# Patient Record
Sex: Male | Born: 1960
Health system: Southern US, Community
[De-identification: ages and names within clinical notes are randomized; demographics above are authoritative.]

## PROBLEM LIST (undated history)

## (undated) DIAGNOSIS — K5792 Diverticulitis of intestine, part unspecified, without perforation or abscess without bleeding: Secondary | ICD-10-CM

## (undated) DIAGNOSIS — J45909 Unspecified asthma, uncomplicated: Secondary | ICD-10-CM

## (undated) DIAGNOSIS — E7800 Pure hypercholesterolemia, unspecified: Secondary | ICD-10-CM

## (undated) DIAGNOSIS — C61 Malignant neoplasm of prostate: Secondary | ICD-10-CM

## (undated) DIAGNOSIS — M353 Polymyalgia rheumatica: Secondary | ICD-10-CM

## (undated) DIAGNOSIS — Z87442 Personal history of urinary calculi: Secondary | ICD-10-CM

## (undated) DIAGNOSIS — I10 Essential (primary) hypertension: Secondary | ICD-10-CM

## (undated) HISTORY — DX: Unspecified asthma, uncomplicated: J45.909

## (undated) HISTORY — DX: Essential (primary) hypertension: I10

## (undated) HISTORY — DX: Malignant neoplasm of prostate: C61

## (undated) HISTORY — DX: Pure hypercholesterolemia, unspecified: E78.00

## (undated) HISTORY — PX: SPINE SURGERY: SHX786

## (undated) HISTORY — PX: NECK SURGERY: SHX720

---

## 2001-12-01 ENCOUNTER — Encounter: Payer: Self-pay | Admitting: Neurosurgery

## 2001-12-03 ENCOUNTER — Encounter: Payer: Self-pay | Admitting: Neurosurgery

## 2001-12-03 ENCOUNTER — Ambulatory Visit (HOSPITAL_COMMUNITY): Admission: RE | Admit: 2001-12-03 | Discharge: 2001-12-04 | Payer: Self-pay | Admitting: Neurosurgery

## 2002-01-02 ENCOUNTER — Ambulatory Visit (HOSPITAL_COMMUNITY): Admission: RE | Admit: 2002-01-02 | Discharge: 2002-01-02 | Payer: Self-pay | Admitting: Neurosurgery

## 2002-01-02 ENCOUNTER — Encounter: Payer: Self-pay | Admitting: Neurosurgery

## 2002-04-02 ENCOUNTER — Encounter: Payer: Self-pay | Admitting: Neurosurgery

## 2002-04-02 ENCOUNTER — Ambulatory Visit (HOSPITAL_COMMUNITY): Admission: RE | Admit: 2002-04-02 | Discharge: 2002-04-02 | Payer: Self-pay | Admitting: Neurosurgery

## 2003-01-21 ENCOUNTER — Ambulatory Visit (HOSPITAL_COMMUNITY): Admission: RE | Admit: 2003-01-21 | Discharge: 2003-01-21 | Payer: Self-pay | Admitting: Internal Medicine

## 2003-01-21 ENCOUNTER — Encounter: Payer: Self-pay | Admitting: Internal Medicine

## 2003-02-12 ENCOUNTER — Encounter: Admission: RE | Admit: 2003-02-12 | Discharge: 2003-02-12 | Payer: Self-pay | Admitting: Otolaryngology

## 2003-02-12 ENCOUNTER — Encounter: Payer: Self-pay | Admitting: Otolaryngology

## 2004-01-11 ENCOUNTER — Emergency Department (HOSPITAL_COMMUNITY): Admission: EM | Admit: 2004-01-11 | Discharge: 2004-01-11 | Payer: Self-pay | Admitting: *Deleted

## 2004-04-04 ENCOUNTER — Ambulatory Visit (HOSPITAL_COMMUNITY): Admission: RE | Admit: 2004-04-04 | Discharge: 2004-04-04 | Payer: Self-pay | Admitting: Pulmonary Disease

## 2005-08-10 ENCOUNTER — Ambulatory Visit (HOSPITAL_COMMUNITY): Admission: RE | Admit: 2005-08-10 | Discharge: 2005-08-10 | Payer: Self-pay | Admitting: Internal Medicine

## 2005-09-25 ENCOUNTER — Ambulatory Visit: Payer: Self-pay | Admitting: Internal Medicine

## 2005-10-12 ENCOUNTER — Encounter: Payer: Self-pay | Admitting: Internal Medicine

## 2005-10-12 ENCOUNTER — Ambulatory Visit: Payer: Self-pay | Admitting: Internal Medicine

## 2005-10-12 ENCOUNTER — Ambulatory Visit (HOSPITAL_COMMUNITY): Admission: RE | Admit: 2005-10-12 | Discharge: 2005-10-12 | Payer: Self-pay | Admitting: Internal Medicine

## 2007-11-03 ENCOUNTER — Ambulatory Visit: Payer: Self-pay | Admitting: Pulmonary Disease

## 2007-12-18 HISTORY — PX: ROBOT ASSISTED LAPAROSCOPIC RADICAL PROSTATECTOMY: SHX5141

## 2007-12-25 DIAGNOSIS — J45909 Unspecified asthma, uncomplicated: Secondary | ICD-10-CM | POA: Insufficient documentation

## 2007-12-25 DIAGNOSIS — E785 Hyperlipidemia, unspecified: Secondary | ICD-10-CM | POA: Insufficient documentation

## 2007-12-25 DIAGNOSIS — C61 Malignant neoplasm of prostate: Secondary | ICD-10-CM | POA: Insufficient documentation

## 2007-12-29 ENCOUNTER — Encounter: Payer: Self-pay | Admitting: Internal Medicine

## 2008-01-14 ENCOUNTER — Inpatient Hospital Stay (HOSPITAL_COMMUNITY): Admission: RE | Admit: 2008-01-14 | Discharge: 2008-01-16 | Payer: Self-pay | Admitting: Urology

## 2008-01-14 ENCOUNTER — Encounter (INDEPENDENT_AMBULATORY_CARE_PROVIDER_SITE_OTHER): Payer: Self-pay | Admitting: Urology

## 2009-04-11 DIAGNOSIS — T7840XA Allergy, unspecified, initial encounter: Secondary | ICD-10-CM | POA: Insufficient documentation

## 2009-04-11 DIAGNOSIS — K921 Melena: Secondary | ICD-10-CM | POA: Insufficient documentation

## 2009-04-12 ENCOUNTER — Telehealth (INDEPENDENT_AMBULATORY_CARE_PROVIDER_SITE_OTHER): Payer: Self-pay

## 2009-04-12 ENCOUNTER — Ambulatory Visit: Payer: Self-pay | Admitting: Internal Medicine

## 2009-04-13 DIAGNOSIS — K594 Anal spasm: Secondary | ICD-10-CM | POA: Insufficient documentation

## 2009-09-23 ENCOUNTER — Encounter: Payer: Self-pay | Admitting: Urgent Care

## 2010-04-11 ENCOUNTER — Telehealth (INDEPENDENT_AMBULATORY_CARE_PROVIDER_SITE_OTHER): Payer: Self-pay | Admitting: *Deleted

## 2010-05-16 ENCOUNTER — Ambulatory Visit: Payer: Self-pay | Admitting: Internal Medicine

## 2010-05-25 ENCOUNTER — Ambulatory Visit: Payer: Self-pay | Admitting: Internal Medicine

## 2010-05-25 ENCOUNTER — Ambulatory Visit (HOSPITAL_COMMUNITY): Admission: RE | Admit: 2010-05-25 | Discharge: 2010-05-25 | Payer: Self-pay | Admitting: Internal Medicine

## 2010-06-03 ENCOUNTER — Encounter: Payer: Self-pay | Admitting: Internal Medicine

## 2010-06-05 ENCOUNTER — Encounter: Payer: Self-pay | Admitting: Internal Medicine

## 2011-01-18 NOTE — Assessment & Plan Note (Signed)
Summary: rectal bleeding & constipation- cdg   Visit Type:  Initial Visit Primary Care Provider:  fagan  Chief Complaint:  constipation and rectal bleeding.  History of Present Illness: 50 year old with intermittent rectal bleeding progressive constipation requiring laxatives. Severe anal pain on occasion particularly when he tries to pass a large bowel movement. He feels like he is passing cut glass. History of anal canal hemorrhoids at colonoscopy in 2006. Has had some vague intermittent abdominal pain. Status post robotic surgery for prostate cancer. He did not get radiation. We saw him one year ago gave him some AnaMantle HC and nitroglycerin ointment which improved the situation but did not totally alleviate it.  Pain gettin much worse.  No family history of colon cancer or colon polyps.  Current Problems (verified): 1)  Proctalgia Fugax  (ICD-564.6) 2)  Hematochezia  (ICD-578.1) 3)  Allergy  (ICD-995.3) 4)  Hx of Carcinoma, Prostate  (ICD-185) 5)  Dyslipidemia  (ICD-272.4) 6)  Asthma  (ICD-493.90)  Current Medications (verified): 1)  Nasonex 50 Mcg/act  Susp (Mometasone Furoate) .Marland Kitchen.. 1 Spray Every Other Day 2)  Aleve .... As Needed 3)  Niacin 500 Mg .... Two Times A Day 4)  Omega 3 Fish Oil 1000 Mg .... Take 1 Tablet By Mouth Once A Day  Allergies (verified): 1)  ! Penicillin 2)  ! Celebrex 3)  ! Sulfa  Past History:  Past Surgical History: Last updated: 04/12/2009 CERVICAL DISK REPLACEMENT HISTORY OF SINUS SURGERY PROSTATE CANCER SURGERY  Family History: Last updated: 04/12/2009 Father: LIVING healthy 67 Mother: living 26 DM Siblings: 4 living  Social History: Last updated: 04/12/2009 Marital Status: Married Children: 1 Occupation: Education administrator  Risk Factors: Smoking Status: quit (12/25/2007)  Vital Signs:  Patient profile:   50 year old male Height:      73 inches Weight:      252 pounds BMI:     33.37 Temp:     98.1 degrees F  oral Pulse rate:   64 / minute BP sitting:   124 / 80  (left arm) Cuff size:   large  Vitals Entered By: Hendricks Limes LPN (May 16, 2010 9:13 AM)  Physical Exam  General:  alert conversant in no acute distress Lungs:  clear to auscultation Heart:  regular rate rhythm without murmur gallop or a Abdomen:  nondistended positive bowel sounds soft nontender without appreciable mass or organomegaly Rectal:  no external lesions. Good sphincter tone. Moderate tenderness to digital exam no appreciable masses. No stool in rectal vault. Hemoccult negative mucus  Impression & Recommendations: Impression: 50 year old gentleman with chronic intermittent hematochezia and intermittent severe rectal pain with bowel movements. Almost certainly he has an anal fissure. A progress constipation somewhat concerning although may to part and parcel of a symptomatic fissure. Has been a good 5 years since he had a colonoscopy.  Recommendations: I told this gentleman he will ultimately most likely need a surgical procedure to correct this problem. However, rectal bleeding needs further investigation.  To this end, I recommended a diagnostic colonoscopy in the very near future. Risks, benefits, limitations, alternatives and imponderables have been reviewed. Questions have been answered; he is agreeable.  Further recommendations to follow.  Appended Document: Orders Update    Clinical Lists Changes  Orders: Added new Service order of Est. Patient Level III (47829) - Signed Added new Service order of Hemoccult Guaiac-1 spec.(in office) (56213) - Signed

## 2011-01-18 NOTE — Letter (Signed)
Summary: TCS ORDER  TCS ORDER   Imported By: Ave Filter 05/16/2010 09:51:01  _____________________________________________________________________  External Attachment:    Type:   Image     Comment:   External Document

## 2011-01-18 NOTE — Letter (Signed)
Summary: Patient Notice, Colon Biopsy Results  [facility]  [address]   [place], [place] [address]   Phone: [phone]  Fax: [phone]       June 03, 2010   [place] [address] [place], [place]  [address] 09-02-1961    Dear Mr. Rudie,  I am pleased to inform you that the biopsies taken during your recent colonoscopy did not show any evidence of cancer upon pathologic examination.  Additional information/recommendations:  Continue with the treatment plan as outlined on the day of your exam.  You should have a repeat colonoscopy examination  in 10 years.  Please call us if you are having persistent problems or have questions about your condition that have not been fully answered at this time.  Sincerely,    R. Roetta Sessions MD, FACP [facility] Ph: [phone]    Fax: [phone]   Appended Document: Patient Notice, Colon Biopsy Results letter mailed to pt  Appended Document: Patient Notice, Colon Biopsy Results reminder in computer

## 2011-01-18 NOTE — Progress Notes (Signed)
  Phone Note Call from Patient   Caller: Patient Summary of Call: Pt came by to schedule appoitment with Dr. Jena Gauss which is scheduled for 05/16/10. He would like for [place] to call in a Rx for Lidocaine Cream to [facility] on [address] He can be reached at [phone].  Initial call taken by: Peggyann Shoals,  April 11, 2010 1:17 PM     Appended Document: anamantle    Prescriptions: LIDOCAINE-HYDROCORTISONE ACE 3-1 % KIT (LIDOCAINE-HYDROCORTISONE ACE) Apply two times a day to rectum  #30 x 0   Entered and Authorized by:   Leanna Battles. Dixon Boos   Signed by:   Leanna Battles Dixon Boos on 04/12/2010   Method used:   Electronically to        [facility] (retail)       [address]       [place], [place]  [address]       Ph: [phone]       Fax: [phone]   RxID:   [id number]     Appended Document:  pts spouse aware

## 2011-04-27 ENCOUNTER — Ambulatory Visit (INDEPENDENT_AMBULATORY_CARE_PROVIDER_SITE_OTHER): Payer: 59 | Admitting: Urology

## 2011-04-27 DIAGNOSIS — N529 Male erectile dysfunction, unspecified: Secondary | ICD-10-CM

## 2011-04-27 DIAGNOSIS — N393 Stress incontinence (female) (male): Secondary | ICD-10-CM

## 2011-04-27 DIAGNOSIS — Z8546 Personal history of malignant neoplasm of prostate: Secondary | ICD-10-CM

## 2011-05-01 NOTE — H&P (Signed)
[name] [name], [name] [name]             ACCOUNT NO.:  [id number]   MEDICAL RECORD NO.:  [id number]          PATIENT TYPE:  INP   LOCATION:  0007                         FACILITY:  [facility]   PHYSICIAN:  [name] [name], M.D.  DATE OF BIRTH:  [birth year]   DATE OF ADMISSION:  01/14/2008  DATE OF DISCHARGE:                              HISTORY & PHYSICAL   DIAGNOSIS:  Adenocarcinoma of prostate.   CHIEF COMPLAINT:  I have prostate cancer.   HISTORY OF PRESENT ILLNESS:  [name] is a very nice 50 year old  white male who presented with a elevating PSA and an elevated PSA  velocity.  His actual number was 2.4.  He is 50 years old and has a very  strong family history of prostate cancer.  He subsequently underwent  biopsy of the prostate which revealed a Gleason score of 6 which was 3 +  3 adenocarcinoma in 50% of the tissue from the left base.  He has  carefully considered all options after understanding the risks,  benefits, and alternatives; and elected to proceed with robotic radical  prostatectomy.   PAST MEDICAL HISTORY:  1. Arthritis.  2. Asthma.  3. Hypercholesterolemia.  4. Borderline diabetes.   SURGERIES:  He has had neck surgery and sinus surgery.   MEDICATIONS INCLUDE:  Fluconazole, nasal spray, fish oil, multivitamins,  Tylenol, and albuterol inhaler.   ALLERGIES:  CELEBREX and PENICILLIN.   FAMILY HISTORY:  Has a maternal history of diabetes, some heart disease.  Paternal history and fraternal history of prostate cancer.  Fraternal  history of nephrolithiasis and paternal grandfather history of heart  disease.   SOCIAL HISTORY:  He does drink and use caffeine.  He has a history of  tobacco use in the past.   REVIEW OF SYSTEMS:  He has no shortness of this exertion, chest pain, or  GI complaints.  He does have occasional shortness of breath when he has  an asthmatic attack, but he has been cleared by Dr. Jetty Duhamel, a  pulmonologist, for that.   PHYSICAL  EXAMINATION:  VITAL SIGNS:  He is afebrile.  Vital signs  stable.  GENERAL:  Well-nourished, well-groomed, oriented x3.  HEAD EYES EARS NOSE THROAT:  Normal.  NECK:  Without masses or thyromegaly.  CHEST:  Has normal diaphragmatic motion.  HEART:  Normal sinus rhythm.  No murmurs, rubs, or gallops.  CHEST:  Clear anteriorly and posteriorly without rales or rhonchi.  ABDOMEN:  Soft, nontender without masses, organomegaly, or hernias.  EXTREMITIES:  Normal.  NEUROLOGIC:  Intact.  SKIN:  Normal.  GENITOURINARY/RECTAL:  Penis, meatus, scrotum, testicle, adnexa, and  anus normal.  Rectal vault empty.  Prostate is approximately 25 grams.  It appears to be smooth, symmetrical, and benign.   IMPRESSION:  Clinical stage T1C adenocarcinoma of the prostate.   PLAN:  Robotic radical prostatectomy.      [name] [name], M.D.  Electronically Signed     RLD/MEDQ  D:  01/14/2008  T:  01/15/2008  Job:  [id number]

## 2011-05-01 NOTE — Op Note (Signed)
[name] [name], [name] [name]             ACCOUNT NO.:  [id number]   MEDICAL RECORD NO.:  [id number]          PATIENT TYPE:  INP   LOCATION:  0007                         FACILITY:  [facility]   PHYSICIAN:  Ronald L. Earlene Plater, M.D.  DATE OF BIRTH:  [birth year]   DATE OF PROCEDURE:  01/14/2008  DATE OF DISCHARGE:                               OPERATIVE REPORT   PREOPERATIVE DIAGNOSIS:  Adenocarcinoma of the prostate.   POSTOPERATIVE DIAGNOSIS:  Adenocarcinoma of the prostate.   OPERATION PERFORMED:  Robotic radical prostatectomy.   SURGEON:  Lucrezia Starch. Earlene Plater, M.D.   ASSISTANT:  Heloise Purpura, MD   ANESTHESIA:  General endotracheal.   ESTIMATED BLOOD LOSS:  100 mL.   TUBES:  20 [place] coude' Foley catheter and large round Blake drain.   COMPLICATIONS:  None.   INDICATIONS FOR PROCEDURE:  [name] [name] is a very nice 50 year old white  male with a very strong family history of prostate cancer.  He had a  rising PSA with a significant velocity although it was only 2.4.  He  subsequently underwent biopsy of the prostate which revealed a Gleason  score 6 which was 3+3 adenocarcinoma in 50% of the tissue in the left  base of the prostate.  After understanding risks, benefits and  alternatives, he has elected to proceed with a robotic radical  prostatectomy.   DESCRIPTION OF PROCEDURE:  The patient was placed in supine position.  After proper general endotracheal anesthesia, was placed in exaggerated  lithotomy position, prepped and draped with Betadine in sterile fashion.  A 24 French Foley catheter was inserted and the bladder was drained.  A  periumbilical incision was made and a camera port was placed with a 12  mm port and the bladder was insufflated and examined carefully with the  laparoscopic camera and no particular lesions were noted to be present.  Under direct vision, the remaining ports were placed.  There were three  robotic working arm ports, the right arm, the left arm and the  fourth  arm and two hand working ports, a 5 mm right lateral and superolateral  and a 12 mm right lateral port were placed.  The bladder was filled with  approximately 100 mL of sterile water and the abdomen had been  insufflated with carbon dioxide and the dissection was begun.  The  anterior space of Retzius was entered.  The median and medial umbilical  ligaments were taken down and the endopelvic fascia was taken down  bilaterally as was a partial resection of the puboprostatic ligaments.  The dorsal vein complex was clipped with endovascular device and cut and  the bladder neck was taken down to the catheter at that point off of the  prostate.  The catheter was then put on tension with the fourth arm and  the posterior dissection of the prostate off the bladder neck was  performed as was dissection of the seminal vesicles and ampulla vas  deferens.  Ampulla vas deferens was incised and the tips of the seminal  vesicles were clipped with Hem-o-lok clips and Denonvilliers fascia was  taken down to the  apex of the prostate.  Bilateral nerve spare was  performed.  The neurovascular bundles were carefully dissected  posterolaterally off of the prostate and the pedicles were taken in  serial packets and clipped with Hem-o-lok clips and the dissection was  carried to the apex of the prostate.  Again neurovascular bundles were  protected, the apex was taken down, the urethra was sharply cut and the  specimen was placed in the right lower quadrant.  Good hemostasis was  noted to be present.  The rectum was noted to be totally intact and the  neurovascular bundles appeared to be very well preserved.  Following  this, the urethrovesical anastomosis was performed.  A holding stitch  was placed with 0-0 Vicryl suture in the posterior urethral area at the  6 o'clock position to Denonvilliers fascia and to the bladder neck.  An  amp of indigo carmine was given intravenously to make sure that we  were  well away from the trigone and we were so.  The anastomosis was then  completed with running 3-0 Monocryl suture both dyed and undyed suture  tied together posteriorly and they were tied anteriorly.  A 20 [place]  coude' catheter was then passed, inflated with 15 mL of sterile water.  The bladder was noted to irrigate clear blue, no leakage was noted to be  present.  A large round Blake drain was placed through the fourth arm  port incision and sutured in place with nylon.  All cannulas were  visually removed except for the camera port.  The specimen was grasped  with EndoCatch bag and the right 12 mm port was closed under direct  vision with 0 Vicryl suture on a suture passer.  The specimen was then  removed from the periumbilical incision and the fascia was closed with  running 0 Vicryl suture.  All wounds were irrigated and injected with  0.25% Marcaine and closed with skin staples and dressed in sterile  fashion.  Again the bladder was noted to irrigate clear.  The specimen  was submitted to pathology.  The patient tolerated the procedure well  and was taken to the recovery room stable.      Ronald L. Earlene Plater, M.D.  Electronically Signed     RLD/MEDQ  D:  01/14/2008  T:  01/15/2008  Job:  [id number]

## 2011-05-01 NOTE — Assessment & Plan Note (Signed)
[facility]                             PULMONARY OFFICE NOTE   NAME:[name], [name] FERRALL                    MRN:          [id number]  DATE:11/03/2007                            DOB:          09/29/1961    HISTORY OF PRESENT ILLNESS:  The patient is a very pleasant 50 year old  gentleman who I have been asked to see for pulmonary preoperative  clearance prior to surgery for prostate cancer.  The patient was  recently diagnosed and is to undergo robotic prostatectomy.  The patient  has a history of asthma dating back to his youth, but he has only been  using albuterol on a p.r.n. basis.  The patient has had no recent  exacerbations for at least 15 years and feels the only time he has  difficulty is when he is exposed to allergens during the spring and  fall.  He has no limit on his exertional tolerance secondary to his  breathing, but does use his rescue inhaler about once a month.  He has  never had spirometry.  The patient also is being referred for the  possibility of obstructive sleep apnea.  He does have a history of  snoring, but no one has ever mentioned pauses in his breathing during  sleep.  He denies any choking arousals.  He typically gets to bed  between 10-11 p.m. and gets up at 5:30 a.m. to start his day.  He is  rested 70% of the time.  He works as a Conservator, museum/gallery and  denies any alertness issues with periods of inactivity.  He has no  difficulty watching movies or TV on weekends or during the week.  He  will get sleepy during the later part of the evening during the week.  He has no difficulty with driving because of sleepiness.  Of note, his  weight is down about 40 pounds over the last two years.   PAST MEDICAL HISTORY:  1. Significant for asthma.  2. Dyslipidemia.  3. History of prostate cancer.  4. History of neck surgery and sinus surgery in the past.   CURRENT MEDICATIONS:  1. Ciprofloxacin 500 mg b.i.d.  2.  Meloxicam 7.5 mg daily.  3. Nasonex one spray every other day.  4. Albuterol p.r.n.   ALLERGIES:  PENICILLIN, CELEBREX.   SOCIAL HISTORY:  He is married.  He has a history of smoking one pack  per day for 10 years.  He has not smoked in 20 years.   FAMILY HISTORY:  Remarkable for his mother having asthma and father  having had prostate cancer.   REVIEW OF SYSTEMS:  As per history of present illness.  Also see patient  intake form documented in the chart.   PHYSICAL EXAMINATION:  GENERAL:  He is an overweight male in no acute  distress.  VITAL SIGNS:  Blood pressure 120/78, pulse 66, temperature 98.8, weight  238 pounds.  He is 6 feet 1 inch tall, O2 saturation on room air is 97%.  HEENT:  Pupils equal, round, and reactive to light and accommodation.  Extraocular muscles are intact.  Nares  are patent without discharge, but  there is a small growth at the opening to his left nostril that has been  there for quite a bit of time and the patient has been wanting to get  rid of this.  Oropharynx does show moderate elongation of soft palate  and uvula.  NECK:  Supple without JVD or lymphadenopathies.  No palpable  thyromegaly.  CHEST:  Totally clear.  HEART:  Regular rate and rhythm.  No murmurs, rubs, or gallops.  ABDOMEN:  Soft and nontender with good bowel sounds.  GENITOURINARY:  RECTAL:  BREASTS:  Not done and not indicated.  EXTREMITIES:  Lower extremities are without edema.  Pulses are intact  distally.  NEUROLOGY:  Alert and oriented with no obvious motor deficits.   LABORATORY DATA:  Spirometry was done today and showed mild air flow  obstruction.   IMPRESSION:  1. History of asthma since childhood, but definite air flow      obstruction on pulmonary function studies.  I have explained to the      patient that he needs to be maintained on chronic inhaled      corticosteroids for persistent asthma even though he has not been      symptomatic.  The danger of not treating  ongoing airway      inflammation in the face of obstruction disease is that he may      develop fixed obstruction instead of reversible obstruction      secondary to airway remodeling.  I really think the patient needs      to be on an inhaled corticosteroid chronically with p.r.n.      albuterol.  He is willing to do this.  I also think this is      important for him in the preoperative and postoperative period.  2. Questionable obstructive sleep apnea.  His history is a little      suggestive of this, however, he certainly does not have moderate or      severe disease in my opinion.  This really should not hold up his      upcoming surgery, and certainly if he has more trouble with daytime      sleepiness in the future we can study him with a nocturnal      polysomnogram.  He should continue to work aggressively on weight      loss.   PLAN:  1. We will start Asmanex two puffs q.h.s. and to use p.r.n. albuterol      for rescue.  2. Continue to work on weight loss.  3. The patient is cleared from a pulmonary standpoint for upcoming      prostate surgery.  4. The patient will follow up with me approximately 3-4 weeks after      his surgery or sooner if there are problems.  I would be more than      happy to see him in the hospital if he develops difficulty.     Barbaraann Share, MD,FCCP  Electronically Signed   [facility]/MedQ  DD: 11/03/2007  DT: 11/04/2007  Job #: [id number]   cc:   [name] [name], M.D.  [name] Callander. Ouida Sills, MD

## 2011-05-04 NOTE — Op Note (Signed)
NAME:  [name] [name], [name] [name]             ACCOUNT NO.:  [id number]   MEDICAL RECORD NO.:  [id number]          PATIENT TYPE:  AMB   LOCATION:  DAY                           FACILITY:  [facility]   PHYSICIAN:  R. Roetta Sessions, M.D. DATE OF BIRTH:  [birth year]   DATE OF PROCEDURE:  10/12/2005  DATE OF DISCHARGE:                                 OPERATIVE REPORT   PROCEDURE:  Colonoscopy with biopsy.   INDICATIONS FOR PROCEDURE:  The patient is a 50 year old gentleman with  intermittent low volume hematochezia sometimes with proctalgia. He has long  periods where he has not passed any blood per rectum. Symptoms have been  ongoing for three years. I saw him in the office on September 25, 2005. He  states he was started on a low carbohydrate diet at that time and has not  seen any blood per rectum. There is no family history of colorectal  neoplasia. He has never had his lower GI tract imaged. He does have positive  family history of colonic polyps. Colonoscopy is now being done. This  approach has been discussed with the patient at length. Potential risks,  benefits, and alternatives have been reviewed and questions answered. He is  agreeable. Please see documentation in the medical record.   PROCEDURE NOTE:  O2 saturation, blood pressure, pulse, and respirations were  monitored throughout the entire procedure. Conscious sedation with Versed 5  mg IV and Demerol 125 mg IV in divided doses.   INSTRUMENT:  Olympus video chip system.   FINDINGS:  Digital rectal exam revealed no abnormalities.   ENDOSCOPIC FINDINGS:  Prep was good.   Colon:  Colonic mucosa was surveyed from the rectosigmoid junction through  the left, transverse, and right colon to the area of the appendiceal  orifice, ileocecal valve, and cecum. These structures were well seen and  photographed for the record. From this level, the scope was slowly  withdrawn, and all previously mentioned mucosal surfaces were again seen.  The patient  had a couple of sigmoid diverticula and two 4-mm polyps at the  hepatic flexure. Both were cold biopsied.   Rectum:  Examination of the rectal mucosa including retroflexed view of the  anal verge and ______________ view of the anal canal demonstrated minimal  anal channel hemorrhoids and a third 4-mm diminutive polyp at 20 cm at the  end of the rectosigmoid. This was also cold biopsied. The remainder of the  rectal mucosa appeared normal. The patient tolerated the procedure well and  was reactive to endoscopy.   IMPRESSION:  1.  Anal channel hemorrhoids. Otherwise normal rectum aside from a      diminutive polyp at 20 cm, cold biopsied/removed.  2.  Few scattered left sided diverticula. Two-diminutive polyps at the      hepatic flexure, cold biopsied/removed. The remainder of the colonic      mucosa appeared normal. I suspect the patient has been bled from benign      anorectal source.   RECOMMENDATIONS:  1.  Hemorrhoid literature provided to Mr. Borquez.  2.  Ten-day course of Anusol HC suppositories one per rectum  at bedtime.  3.  Diverticulosis literature given to Mr. Sime.  4.  It would be a good idea for him to take a fiber supplement on a daily      basis.  5.  Follow up on pathology.  6.  Further recommendations to follow.      Jonathon Bellows, M.D.  Electronically Signed     RMR/MEDQ  D:  10/12/2005  T:  10/12/2005  Job:  [id number]

## 2011-05-04 NOTE — Op Note (Signed)
[place]  Patient:    JUDAH, CARCHI Visit Number: [id number] MRN: [id number]          Service Type: DSU Location: 3000 [id number] Attending Physician:  Mariam Dollar Dictated by:   Garlon Hatchet., M.D. Proc. Date: 12/03/01 Admit Date:  12/03/2001                             Operative Report  PREOPERATIVE DIAGNOSIS:  Cervical spondylosis with C6 and C7 radiculopathy, right.  PROCEDURE:  Anterior cervical diskectomies and fusion at C5-6 and C6-7 using a 6 mm patellar wedge at C5-6 and a 7 mm patellar wedge at C6-7, with a 40 mm Atlantis plate and six 13 mm variable-angled screws.  SURGEON:  Garlon Hatchet., M.D.  ASSISTANT:  Reinaldo Meeker, M.D.  ANESTHESIA:  General endotracheal.  CLINICAL NOTE:  The patient is a very pleasant 50 year old gentleman who has long-standing neck and right greater than left arm pain that radiates down to his thumb and first two fingers.  He had weakness in his triceps on preoperative exam.  His preoperative imaging has severe spondylosis with spur formation especially at 5-6, compressing both C6 nerve roots, and also at C6-7, compressing the left C7 nerve root.  Due to the degree of spondylosis and end plate changes and kyphotic deformity at these end plates, the patient was extensively counselled and recommended anterior cervical diskectomy and fusion.  The patient understands the risks and benefits and decided to proceed forward.  DESCRIPTION OF PROCEDURE:  The patient was brought in the OR and was induced under general anesthesia, positioned supine with the neck in slight extension with a shoulder roll and five pounds of Holter traction.  The right side of his neck was prepped and draped in the usual sterile fashion.  Preoperative x-rays localized the needle over the C5-6 interspace.  A curvilinear incision made just inferior to this just off the midline to the anterior border of  the sternocleidomastoid.  The superficial layer of the platysma was dissected out and divided longitudinally.  The avascular plane between the sternocleidomastoid and the strap muscle was developed down to prevertebral fascia and was dissected away with Kitners.  The longus colli was dissected laterally with Bovie electrocautery.  Intraoperative x-ray confirmed localization of a needle at the C4-5 disk space, and muscle was reflected laterally to the C5-6 and C6-7 disk spaces, and annulotomy was made with an 11 blade scalpel.  The self-retaining retractor was placed.  The high-speed drill was used to drill down the anterior margin of the annulus and end plates down to posterior osteophyte and the posterior longitudinal ligament.  Then using a 1 and 2 mm Kerrison punch, first at C5-6, the posterior longitudinal ligament was identified and removed in a piecemeal fashion.  There was noted to be a tremendous amount of spur formation coming off the C5 vertebral body and the uncinate process, compressing the left C6 nerve root.  This was decompressed out the proximal aspect and attention taken back to the right, and there was a lot of spur formation coming off the C5 vertebral body compressing the right C6 nerve root.  This was radically decompressed, and the foramen was completely decompressed.  It was then explored with an angled nerve hook and noted to have no further stenosis.  Gelfoam was overlaid in this site.  Then attention was taken to the C6-7 disk space.  Again the end plate was drilled down to the posterior longitudinal ligament, and this was removed in a piecemeal fashion.  An osteophyte coming off the C6 vertebral body compressing the proximal aspect of the left C7 nerve root was removed with a 1 and 2 mm Kerrison punch, and the proximal aspects of both C7 nerve roots were decompressed and explored with an angled nerve hook and noted to have no further stenosis.  Both end plates  were then prepared to receive the bone grafts and plate.  A 7 mm patellar wedge was inserted at C6-7 approximately 1 mm deep to the anterior vertebral body line, and at C5-6 a 6 mm patellar wedge was inserted, again 1 mm deep to the anterior vertebral body line.  The vertebral body was then prepared to receive the plate.  A 40 mm plate was sized, selected, and drilled, tapped, and six 13 mm variable-angled screws were inserted.  Meticulous hemostasis was maintained. The longus colli was bipolared, and then the platysma was closed with 3-0 interrupted Vicryls, skin was closed with a running 4-0 subcuticular.  Benzoin and Steri-Strips were applied.  The patient went to the recovery room in stable condition.  At the end of the case, all needle counts and sponge counts were correct.  A postop x-ray confirmed good localization of the plate and screws and bone graft.  Patient sent to recovery in stable condition.Dictated by:   Garlon Hatchet., M.D. Attending Physician:  Mariam Dollar DD:  12/03/01 TD:  12/03/01 Job: [id number]

## 2011-05-04 NOTE — Consult Note (Signed)
NAME:  [name] [name], [name] [name]             ACCOUNT NO.:  [id number]   MEDICAL RECORD NO.:  [id number]          PATIENT TYPE:  AMB   LOCATION:  DAY                           FACILITY:  [facility]   PHYSICIAN:  [name] [name], M.D. DATE OF BIRTH:  05/04/1961   DATE OF CONSULTATION:  09/25/2005  DATE OF DISCHARGE:                                   CONSULTATION   REASON FOR CONSULTATION:  Hematochezia.   HISTORY OF PRESENT ILLNESS:  Mr. [name] [name] is a pleasant 50 year old  gentleman sent through the courtesy of Dr. Carylon [name] to further evaluate 3-  year history of intermittent low-volume hematochezia.  Sometimes it is  associated with constipation, at other times with diarrhea, and still other  times without any apparent bowel dysfunction.  Sometimes he does have  burning pain in his distal rectum when passing a bowel movement in  association with bleeding.  He has significant periods where he has no  apparent bleeding. There has been no associated abdominal pain.  He has had  no melena.  No upper GI tract symptoms such as odynophagia, dysphagia, early  satiety, reflux symptoms, nausea or vomiting.  He has not lost any weight.  There is no family history of colorectal neoplasia, although one brother, an  uncle, and his father had colonic polyps.  He has never had his lower GI  tract imaged.   PAST MEDICAL HISTORY:  Significant for:  1.  Glucose intolerance.  2.  History of seasonal allergies.  3.  Asthma.   PAST SURGICAL HISTORY:  1.  Cervical disk replacement.  2.  History of sinus surgery.   CURRENT MEDICATIONS:  1.  Nasonex spray daily.  2.  Albuterol inhaler p.Jacobn.  3.  Advil 1 daily.  4.  Multivitamins daily.   ALLERGIES:  PENICILLIN, CELEBREX, SULFA.   FAMILY HISTORY:  As outlined above.  Mother and father alive in good health,  one sister in good health, three brothers in good health.   SOCIAL HISTORY:  The patient is married and has one child.  He works for  [employer]. He stopped using tobacco 20 years ago. He occasionally has  a beer.   REVIEW OF SYSTEMS:  No chest pain or dyspnea on exertion.  No change in  weight.  No fever or chills.   PHYSICAL EXAMINATION:  GENERAL:  Very pleasant 50 year old gentleman,  resting comfortably.  VITAL SIGNS: Weight 241.5, height 6 feet 1 inch.  Temperature 98.3, blood  pressure 110/74, pulse 60.  SKIN: Warm and dry, no jaundice or continuous stigmata of chronic liver  disease.  HEENT:  No scleral icterus.  JVD not prominent.  CHEST: Lungs are clear to auscultation.  CARDIAC: Regular rate and rhythm without murmur, gallop, or rub.  ABDOMEN:  Obese. Positive bowel sounds, soft, nontender, without appreciable  mass or organomegaly.  EXTREMITIES:  No edema.  RECTAL: Exam deferred to time of colonoscopy.   IMPRESSION:  Mr. [name] [name] is a pleasant 50 year old gentleman with  low-volume hematochezia. He has had symptoms for 3 years. He needs to have  his lower GI tract  evaluated to further evaluate this problem.  To this end,  I have recommended to [name] [name] diagnostic colonoscopy.  Potential risks,  benefits, and alternatives have been reviewed and questions answered.  He is  agreeable.  Will plan to perform colonoscopy in the very near future and  will make further recommendations at that time.   I would like to thank Dr. Carylon [name] for allowing me to see this nice  gentleman.      [name] [name], M.D.  Electronically Signed     RMR/MEDQ  D:  09/25/2005  T:  09/25/2005  Job:  [id number]   cc:   [name] Callander. Ouida Sills, MD  Fax: [phone]

## 2011-05-28 ENCOUNTER — Emergency Department (HOSPITAL_COMMUNITY)
Admission: EM | Admit: 2011-05-28 | Discharge: 2011-05-28 | Disposition: A | Discharge status: Home / Self Care | Payer: 59 | Attending: Emergency Medicine | Admitting: Emergency Medicine

## 2011-05-28 ENCOUNTER — Emergency Department (HOSPITAL_COMMUNITY): Payer: 59

## 2011-05-28 DIAGNOSIS — N200 Calculus of kidney: Secondary | ICD-10-CM | POA: Insufficient documentation

## 2011-05-28 DIAGNOSIS — Z8546 Personal history of malignant neoplasm of prostate: Secondary | ICD-10-CM | POA: Insufficient documentation

## 2011-05-28 DIAGNOSIS — Z79899 Other long term (current) drug therapy: Secondary | ICD-10-CM | POA: Insufficient documentation

## 2011-05-28 LAB — DIFFERENTIAL
Basophils Absolute: 0.1 10*3/uL (ref 0.0–0.1)
Eosinophils Absolute: 0.5 10*3/uL (ref 0.0–0.7)
Lymphocytes Relative: 49 % — ABNORMAL HIGH (ref 12–46)
Monocytes Absolute: 1.1 10*3/uL — ABNORMAL HIGH (ref 0.1–1.0)
Neutro Abs: 2.8 10*3/uL (ref 1.7–7.7)
Neutrophils Relative %: 32 % — ABNORMAL LOW (ref 43–77)

## 2011-05-28 LAB — URINALYSIS, ROUTINE W REFLEX MICROSCOPIC
Nitrite: NEGATIVE
Protein, ur: NEGATIVE mg/dL
Urobilinogen, UA: 0.2 mg/dL (ref 0.0–1.0)
pH: 6.5 (ref 5.0–8.0)

## 2011-05-28 LAB — COMPREHENSIVE METABOLIC PANEL
ALT: 19 U/L (ref 0–53)
Alkaline Phosphatase: 57 U/L (ref 39–117)
Calcium: 9.6 mg/dL (ref 8.4–10.5)
Creatinine, Ser: 1.29 mg/dL (ref 0.4–1.5)
Total Protein: 7.2 g/dL (ref 6.0–8.3)

## 2011-05-28 LAB — CBC
Hemoglobin: 16 g/dL (ref 13.0–17.0)
MCHC: 34.2 g/dL (ref 30.0–36.0)
MCV: 84.2 fL (ref 78.0–100.0)
RDW: 12.8 % (ref 11.5–15.5)
WBC: 8.7 10*3/uL (ref 4.0–10.5)

## 2011-09-07 LAB — COMPREHENSIVE METABOLIC PANEL
Alkaline Phosphatase: 46
BUN: 14
CO2: 31
Chloride: 106
GFR calc Af Amer: >60
Glucose, Bld: 110 — ABNORMAL HIGH
Sodium: 144
Total Bilirubin: 0.8
Total Protein: 7.2

## 2011-09-07 LAB — URINALYSIS, ROUTINE W REFLEX MICROSCOPIC
Bilirubin Urine: NEGATIVE
Glucose, UA: NEGATIVE
Hgb urine dipstick: NEGATIVE
Ketones, ur: NEGATIVE
Nitrite: NEGATIVE
Protein, ur: NEGATIVE
Specific Gravity, Urine: 1.007

## 2011-09-07 LAB — DIFFERENTIAL
Basophils Relative: 0
Eosinophils Absolute: 0
Lymphocytes Relative: 8 — ABNORMAL LOW
Monocytes Absolute: 1.4 — ABNORMAL HIGH

## 2011-09-07 LAB — CBC
HCT: 45.4
Hemoglobin: 14.1
MCHC: 34.4
MCV: 84.3
Platelets: 186
RBC: 4.89

## 2011-09-07 LAB — BASIC METABOLIC PANEL
Creatinine, Ser: 1.08
GFR calc Af Amer: >60
Potassium: 4.2

## 2011-09-07 LAB — APTT: aPTT: 28

## 2011-09-07 LAB — PROTIME-INR: INR: 0.9

## 2012-03-12 ENCOUNTER — Encounter: Payer: Self-pay | Admitting: Internal Medicine

## 2013-10-30 ENCOUNTER — Ambulatory Visit (INDEPENDENT_AMBULATORY_CARE_PROVIDER_SITE_OTHER): Payer: 59 | Admitting: Urology

## 2013-10-30 ENCOUNTER — Encounter (INDEPENDENT_AMBULATORY_CARE_PROVIDER_SITE_OTHER): Payer: Self-pay

## 2013-10-30 DIAGNOSIS — N393 Stress incontinence (female) (male): Secondary | ICD-10-CM

## 2013-10-30 DIAGNOSIS — Z8546 Personal history of malignant neoplasm of prostate: Secondary | ICD-10-CM

## 2013-10-30 DIAGNOSIS — N529 Male erectile dysfunction, unspecified: Secondary | ICD-10-CM

## 2014-10-22 ENCOUNTER — Ambulatory Visit: Payer: 59 | Admitting: Urology

## 2015-07-15 ENCOUNTER — Ambulatory Visit (INDEPENDENT_AMBULATORY_CARE_PROVIDER_SITE_OTHER): Payer: 59 | Admitting: Urology

## 2015-07-15 DIAGNOSIS — Z8546 Personal history of malignant neoplasm of prostate: Secondary | ICD-10-CM | POA: Diagnosis not present

## 2015-07-15 DIAGNOSIS — N393 Stress incontinence (female) (male): Secondary | ICD-10-CM | POA: Diagnosis not present

## 2015-07-15 DIAGNOSIS — N5201 Erectile dysfunction due to arterial insufficiency: Secondary | ICD-10-CM | POA: Diagnosis not present

## 2015-09-28 ENCOUNTER — Encounter: Payer: Self-pay | Admitting: Internal Medicine

## 2016-07-20 ENCOUNTER — Ambulatory Visit (INDEPENDENT_AMBULATORY_CARE_PROVIDER_SITE_OTHER): Payer: 59 | Admitting: Urology

## 2016-07-20 DIAGNOSIS — Z8546 Personal history of malignant neoplasm of prostate: Secondary | ICD-10-CM

## 2016-07-20 DIAGNOSIS — N393 Stress incontinence (female) (male): Secondary | ICD-10-CM

## 2016-07-20 DIAGNOSIS — N5231 Erectile dysfunction following radical prostatectomy: Secondary | ICD-10-CM | POA: Diagnosis not present

## 2016-10-15 ENCOUNTER — Ambulatory Visit (INDEPENDENT_AMBULATORY_CARE_PROVIDER_SITE_OTHER): Payer: 59 | Admitting: Otolaryngology

## 2017-03-06 DIAGNOSIS — H903 Sensorineural hearing loss, bilateral: Secondary | ICD-10-CM | POA: Diagnosis not present

## 2017-03-06 DIAGNOSIS — H6121 Impacted cerumen, right ear: Secondary | ICD-10-CM | POA: Diagnosis not present

## 2017-10-07 DIAGNOSIS — E785 Hyperlipidemia, unspecified: Secondary | ICD-10-CM | POA: Diagnosis not present

## 2017-10-07 DIAGNOSIS — D075 Carcinoma in situ of prostate: Secondary | ICD-10-CM | POA: Diagnosis not present

## 2017-10-07 DIAGNOSIS — Z79899 Other long term (current) drug therapy: Secondary | ICD-10-CM | POA: Diagnosis not present

## 2017-10-07 DIAGNOSIS — J45909 Unspecified asthma, uncomplicated: Secondary | ICD-10-CM | POA: Diagnosis not present

## 2017-10-07 DIAGNOSIS — Z125 Encounter for screening for malignant neoplasm of prostate: Secondary | ICD-10-CM | POA: Diagnosis not present

## 2017-10-14 DIAGNOSIS — Z0001 Encounter for general adult medical examination with abnormal findings: Secondary | ICD-10-CM | POA: Diagnosis not present

## 2017-10-14 DIAGNOSIS — E785 Hyperlipidemia, unspecified: Secondary | ICD-10-CM | POA: Diagnosis not present

## 2018-08-05 DIAGNOSIS — R972 Elevated prostate specific antigen [PSA]: Secondary | ICD-10-CM | POA: Diagnosis not present

## 2018-08-08 ENCOUNTER — Ambulatory Visit: Payer: 59 | Admitting: Urology

## 2018-08-08 DIAGNOSIS — N393 Stress incontinence (female) (male): Secondary | ICD-10-CM | POA: Diagnosis not present

## 2018-08-08 DIAGNOSIS — Z8546 Personal history of malignant neoplasm of prostate: Secondary | ICD-10-CM | POA: Diagnosis not present

## 2018-08-08 DIAGNOSIS — N5231 Erectile dysfunction following radical prostatectomy: Secondary | ICD-10-CM

## 2018-08-25 DIAGNOSIS — Z6834 Body mass index (BMI) 34.0-34.9, adult: Secondary | ICD-10-CM | POA: Diagnosis not present

## 2018-08-25 DIAGNOSIS — J019 Acute sinusitis, unspecified: Secondary | ICD-10-CM | POA: Diagnosis not present

## 2018-08-25 DIAGNOSIS — R030 Elevated blood-pressure reading, without diagnosis of hypertension: Secondary | ICD-10-CM | POA: Diagnosis not present

## 2018-11-20 DIAGNOSIS — Z125 Encounter for screening for malignant neoplasm of prostate: Secondary | ICD-10-CM | POA: Diagnosis not present

## 2018-11-20 DIAGNOSIS — E785 Hyperlipidemia, unspecified: Secondary | ICD-10-CM | POA: Diagnosis not present

## 2018-11-20 DIAGNOSIS — Z8546 Personal history of malignant neoplasm of prostate: Secondary | ICD-10-CM | POA: Diagnosis not present

## 2018-11-20 DIAGNOSIS — Z79899 Other long term (current) drug therapy: Secondary | ICD-10-CM | POA: Diagnosis not present

## 2018-11-20 DIAGNOSIS — J45902 Unspecified asthma with status asthmaticus: Secondary | ICD-10-CM | POA: Diagnosis not present

## 2018-11-27 DIAGNOSIS — Z0001 Encounter for general adult medical examination with abnormal findings: Secondary | ICD-10-CM | POA: Diagnosis not present

## 2018-11-27 DIAGNOSIS — J45901 Unspecified asthma with (acute) exacerbation: Secondary | ICD-10-CM | POA: Diagnosis not present

## 2018-11-27 DIAGNOSIS — N200 Calculus of kidney: Secondary | ICD-10-CM | POA: Diagnosis not present

## 2019-01-02 DIAGNOSIS — I10 Essential (primary) hypertension: Secondary | ICD-10-CM | POA: Diagnosis not present

## 2019-04-10 ENCOUNTER — Other Ambulatory Visit (HOSPITAL_COMMUNITY): Payer: Self-pay | Admitting: Internal Medicine

## 2019-04-10 ENCOUNTER — Other Ambulatory Visit: Payer: Self-pay

## 2019-04-10 ENCOUNTER — Ambulatory Visit (HOSPITAL_COMMUNITY)
Admission: RE | Admit: 2019-04-10 | Discharge: 2019-04-10 | Disposition: A | Discharge status: Home / Self Care | Payer: 59 | Source: Ambulatory Visit | Attending: Internal Medicine | Admitting: Internal Medicine

## 2019-04-10 ENCOUNTER — Other Ambulatory Visit: Payer: Self-pay | Admitting: Internal Medicine

## 2019-04-10 DIAGNOSIS — R0602 Shortness of breath: Secondary | ICD-10-CM

## 2019-04-10 DIAGNOSIS — I10 Essential (primary) hypertension: Secondary | ICD-10-CM | POA: Diagnosis not present

## 2019-04-10 LAB — POCT I-STAT CREATININE: Creatinine, Ser: 1.2 mg/dL (ref 0.61–1.24)

## 2019-04-10 MED ORDER — IOHEXOL 350 MG/ML SOLN
100.0000 mL | Freq: Once | INTRAVENOUS | Status: AC | PRN
  Administered 2019-04-10: 16:00:00 100 mL via INTRAVENOUS

## 2019-04-10 MED ORDER — IOHEXOL 350 MG/ML SOLN: 100.0000 mL | Freq: Once | INTRAVENOUS | Status: DC | PRN

## 2019-08-12 DIAGNOSIS — Z8546 Personal history of malignant neoplasm of prostate: Secondary | ICD-10-CM | POA: Diagnosis not present

## 2019-08-21 ENCOUNTER — Other Ambulatory Visit: Payer: Self-pay

## 2019-08-21 ENCOUNTER — Ambulatory Visit (INDEPENDENT_AMBULATORY_CARE_PROVIDER_SITE_OTHER): Payer: BC Managed Care – PPO | Admitting: Urology

## 2019-08-21 DIAGNOSIS — N5231 Erectile dysfunction following radical prostatectomy: Secondary | ICD-10-CM | POA: Diagnosis not present

## 2019-08-21 DIAGNOSIS — Z8546 Personal history of malignant neoplasm of prostate: Secondary | ICD-10-CM

## 2019-12-01 DIAGNOSIS — I10 Essential (primary) hypertension: Secondary | ICD-10-CM | POA: Diagnosis not present

## 2019-12-01 DIAGNOSIS — E785 Hyperlipidemia, unspecified: Secondary | ICD-10-CM | POA: Diagnosis not present

## 2019-12-01 DIAGNOSIS — J45902 Unspecified asthma with status asthmaticus: Secondary | ICD-10-CM | POA: Diagnosis not present

## 2019-12-01 DIAGNOSIS — Z79899 Other long term (current) drug therapy: Secondary | ICD-10-CM | POA: Diagnosis not present

## 2019-12-08 DIAGNOSIS — Z8546 Personal history of malignant neoplasm of prostate: Secondary | ICD-10-CM | POA: Diagnosis not present

## 2019-12-08 DIAGNOSIS — I10 Essential (primary) hypertension: Secondary | ICD-10-CM | POA: Diagnosis not present

## 2019-12-08 DIAGNOSIS — J45909 Unspecified asthma, uncomplicated: Secondary | ICD-10-CM | POA: Diagnosis not present

## 2019-12-08 DIAGNOSIS — E785 Hyperlipidemia, unspecified: Secondary | ICD-10-CM | POA: Diagnosis not present

## 2019-12-29 ENCOUNTER — Other Ambulatory Visit: Payer: Self-pay

## 2019-12-29 DIAGNOSIS — Z8546 Personal history of malignant neoplasm of prostate: Secondary | ICD-10-CM

## 2020-01-22 ENCOUNTER — Other Ambulatory Visit: Payer: BC Managed Care – PPO

## 2020-01-22 ENCOUNTER — Other Ambulatory Visit: Payer: Self-pay

## 2020-01-22 ENCOUNTER — Ambulatory Visit: Payer: BC Managed Care – PPO | Attending: Internal Medicine

## 2020-01-22 DIAGNOSIS — Z20822 Contact with and (suspected) exposure to covid-19: Secondary | ICD-10-CM

## 2020-01-24 LAB — NOVEL CORONAVIRUS, NAA: SARS-CoV-2, NAA: NOT DETECTED

## 2020-02-22 DIAGNOSIS — Z23 Encounter for immunization: Secondary | ICD-10-CM | POA: Diagnosis not present

## 2020-03-22 DIAGNOSIS — Z23 Encounter for immunization: Secondary | ICD-10-CM | POA: Diagnosis not present

## 2020-05-11 ENCOUNTER — Encounter: Payer: Self-pay | Admitting: Internal Medicine

## 2020-06-21 DIAGNOSIS — Z6834 Body mass index (BMI) 34.0-34.9, adult: Secondary | ICD-10-CM | POA: Diagnosis not present

## 2020-06-21 DIAGNOSIS — I10 Essential (primary) hypertension: Secondary | ICD-10-CM | POA: Diagnosis not present

## 2020-09-20 ENCOUNTER — Other Ambulatory Visit: Payer: Self-pay

## 2020-09-20 ENCOUNTER — Other Ambulatory Visit: Payer: BC Managed Care – PPO

## 2020-09-20 DIAGNOSIS — Z20822 Contact with and (suspected) exposure to covid-19: Secondary | ICD-10-CM | POA: Diagnosis not present

## 2020-09-21 LAB — NOVEL CORONAVIRUS, NAA: SARS-CoV-2, NAA: NOT DETECTED

## 2020-09-21 LAB — SPECIMEN STATUS REPORT

## 2020-09-21 LAB — SARS-COV-2, NAA 2 DAY TAT

## 2020-11-25 ENCOUNTER — Other Ambulatory Visit: Payer: Self-pay

## 2020-11-25 ENCOUNTER — Other Ambulatory Visit: Payer: BC Managed Care – PPO

## 2020-11-25 DIAGNOSIS — Z8546 Personal history of malignant neoplasm of prostate: Secondary | ICD-10-CM

## 2020-11-26 LAB — PSA: Prostate Specific Ag, Serum: <0.1 ng/mL (ref 0.0–4.0)

## 2020-12-02 ENCOUNTER — Ambulatory Visit (INDEPENDENT_AMBULATORY_CARE_PROVIDER_SITE_OTHER): Payer: BC Managed Care – PPO | Admitting: Urology

## 2020-12-02 ENCOUNTER — Encounter: Payer: Self-pay | Admitting: Urology

## 2020-12-02 ENCOUNTER — Other Ambulatory Visit: Payer: Self-pay

## 2020-12-02 VITALS — BP 135/80 | HR 69 | Temp 98.7°F | Ht 73.0 in | Wt 240.0 lb

## 2020-12-02 DIAGNOSIS — N5231 Erectile dysfunction following radical prostatectomy: Secondary | ICD-10-CM

## 2020-12-02 DIAGNOSIS — N393 Stress incontinence (female) (male): Secondary | ICD-10-CM

## 2020-12-02 DIAGNOSIS — Z8546 Personal history of malignant neoplasm of prostate: Secondary | ICD-10-CM | POA: Diagnosis not present

## 2020-12-02 DIAGNOSIS — R3129 Other microscopic hematuria: Secondary | ICD-10-CM | POA: Diagnosis not present

## 2020-12-02 LAB — MICROSCOPIC EXAMINATION
Bacteria, UA: NONE SEEN
Epithelial Cells (non renal): NONE SEEN /hpf (ref 0–10)
Renal Epithel, UA: NONE SEEN /hpf
WBC, UA: NONE SEEN /hpf (ref 0–5)

## 2020-12-02 LAB — URINALYSIS, ROUTINE W REFLEX MICROSCOPIC
Bilirubin, UA: NEGATIVE
Glucose, UA: NEGATIVE
Leukocytes,UA: NEGATIVE
Nitrite, UA: NEGATIVE
Protein,UA: NEGATIVE
Specific Gravity, UA: 1.025 (ref 1.005–1.030)
Urobilinogen, Ur: 1 mg/dL (ref 0.2–1.0)
pH, UA: 7 (ref 5.0–7.5)

## 2020-12-02 MED ORDER — SILDENAFIL CITRATE 20 MG PO TABS: ORAL_TABLET | ORAL | 5 refills | Status: DC

## 2020-12-02 NOTE — Progress Notes (Signed)
Urological Symptom Review  Patient is experiencing the following symptoms: Leakage of urine Erection problems  Review of Systems  Gastrointestinal (upper)  : Negative for upper GI symptoms  Gastrointestinal (lower) : Negative for lower GI symptoms  Constitutional : Negative for symptoms  Skin: Negative for skin symptoms  Eyes: Negative for eye symptoms  Ear/Nose/Throat : Sinus problems  Hematologic/Lymphatic: Easy bruising  Cardiovascular : Negative for cardiovascular symptoms  Respiratory : Shortness of breath  Endocrine: Negative for endocrine symptoms  Musculoskeletal: Joint pain  Neurological: Negative for neurological symptoms  Psychologic: Negative for psychiatric symptoms

## 2020-12-02 NOTE — Progress Notes (Signed)
Subjective:  1. Personal history of malignant neoplasm of prostate   2. Erectile dysfunction after radical prostatectomy   3. Male stress incontinence   4. Microhematuria      [name] [name] returns today in f/u for his history of T2b, gleason 7 prostate cancer treated with prostatectomy in 2009. His PSA remains undetectible.   He ED and is managed with sildenafil.  He has had some SUI but it is rare.  He doesn't require protection.  His IPSS is 2 with nocturia x 2.  He is doing well but he has some increased mylagias and joint pain that requires ibuprofen.   He has a history of stones but on worrisome symptoms.   His UA today has 3-10 RBC.  He is a former smoker with an 79 pk year history but he quit 40+ years ago.     ROS:  ROS:  A complete review of systems was performed.  All systems are negative except for pertinent findings as noted.   ROS  Allergies  Allergen Reactions  . Celecoxib   . Penicillins   . Sulfonamide Derivatives     REACTION: unknown reaction    Outpatient Encounter Medications as of 12/02/2020  Medication Sig  . amLODipine (NORVASC) 5 MG tablet Take 5 mg by mouth daily.  . sildenafil (REVATIO) 20 MG tablet 1-5 po qday prn   No facility-administered encounter medications on file as of 12/02/2020.    Past Medical History:  Diagnosis Date  . Asthma   . Hypercholesteremia   . Hypertension   . Prostate cancer [facility])     Past Surgical History:  Procedure Laterality Date  . NECK SURGERY    . ROBOT ASSISTED LAPAROSCOPIC RADICAL PROSTATECTOMY  2009  . SPINE SURGERY      Social History   Socioeconomic History  . Marital status: Married    Spouse name: Not on file  . Number of children: 1  . Years of education: Not on file  . Highest education level: Not on file  Occupational History  . Occupation: Careers information officer  Tobacco Use  . Smoking status: Former Smoker    Packs/day: 2.00    Years: 40.00    Pack years: 80.00    Quit date:  12/03/1979    Years since quitting: 41.0  . Smokeless tobacco: Never Used  Substance and Sexual Activity  . Alcohol use: Not on file  . Drug use: Not on file  . Sexual activity: Not on file  Other Topics Concern  . Not on file  Social History Narrative  . Not on file   Social Determinants of Health   Financial Resource Strain: Not on file  Food Insecurity: Not on file  Transportation Needs: Not on file  Physical Activity: Not on file  Stress: Not on file  Social Connections: Not on file  Intimate Partner Violence: Not on file    History reviewed. No pertinent family history.     Objective: Vitals:   12/02/20 1440  BP: 135/80  Pulse: 69  Temp: 98.7 F (37.1 C)     Physical Exam  Lab Results:  PSA No results found for: PSA No results found for: TESTOSTERONE  Lab Results  Component Value Date   PSA1 <0.1 11/25/2020   UA 3-10 RBC's  Studies/Results: No results found. No results found for this or any previous visit.  No results found for this or any previous visit.  No results found for this or any previous visit.  No results  found for this or any previous visit.  No results found for this or any previous visit.  No results found for this or any previous visit.  No results found for this or any previous visit.  No results found for this or any previous visit.    Assessment & Plan: History of prostate cancer with undetectible PSA s/p prostatectomy.   Repeat PSA in a year.  Microhematuria.   I will get him set up for a CT hematuria study and return for possible cystoscopy.  ED.  He continues to respond to sildenafil which was refilled.  SUI.  Mild. No treatment needed.     Meds ordered this encounter  Medications  . sildenafil (REVATIO) 20 MG tablet    Sig: 1-5 po qday prn    Dispense:  90 tablet    Refill:  5     Orders Placed This Encounter  Procedures  . Microscopic Examination  . CT HEMATURIA WORKUP    Standing Status:   Future     Standing Expiration Date:   01/02/2021    Scheduling Instructions:     He is probably going to want to check pricing around the area.    Order Specific Question:   Reason for Exam (SYMPTOM  OR DIAGNOSIS REQUIRED)    Answer:   microhematuria.    Order Specific Question:   Preferred imaging location?    Answer:   [facility]    Order Specific Question:   Radiology Contrast Protocol - do NOT remove file path    Answer:   [email or web address]  . Urinalysis, Routine w reflex microscopic      Return in about 3 weeks (around 12/23/2020) for with CT results for possible cystoscopy..   CC: Asencion Noble, MD      [name] [name] 12/03/2020

## 2020-12-08 ENCOUNTER — Other Ambulatory Visit (HOSPITAL_COMMUNITY): Payer: BC Managed Care – PPO

## 2020-12-08 DIAGNOSIS — Z79899 Other long term (current) drug therapy: Secondary | ICD-10-CM | POA: Diagnosis not present

## 2020-12-08 DIAGNOSIS — I10 Essential (primary) hypertension: Secondary | ICD-10-CM | POA: Diagnosis not present

## 2020-12-08 DIAGNOSIS — E785 Hyperlipidemia, unspecified: Secondary | ICD-10-CM | POA: Diagnosis not present

## 2020-12-08 DIAGNOSIS — E663 Overweight: Secondary | ICD-10-CM | POA: Diagnosis not present

## 2020-12-15 DIAGNOSIS — J45909 Unspecified asthma, uncomplicated: Secondary | ICD-10-CM | POA: Diagnosis not present

## 2020-12-15 DIAGNOSIS — I10 Essential (primary) hypertension: Secondary | ICD-10-CM | POA: Diagnosis not present

## 2020-12-15 DIAGNOSIS — Z0000 Encounter for general adult medical examination without abnormal findings: Secondary | ICD-10-CM | POA: Diagnosis not present

## 2020-12-15 DIAGNOSIS — R319 Hematuria, unspecified: Secondary | ICD-10-CM | POA: Diagnosis not present

## 2020-12-15 DIAGNOSIS — M1389 Other specified arthritis, multiple sites: Secondary | ICD-10-CM | POA: Diagnosis not present

## 2020-12-15 DIAGNOSIS — Z23 Encounter for immunization: Secondary | ICD-10-CM | POA: Diagnosis not present

## 2020-12-15 DIAGNOSIS — M791 Myalgia, unspecified site: Secondary | ICD-10-CM | POA: Diagnosis not present

## 2020-12-21 ENCOUNTER — Telehealth: Payer: Self-pay

## 2020-12-21 NOTE — Telephone Encounter (Signed)
-----  Message from Irine Seal, MD sent at 12/21/2020  7:16 AM EST ----- Regarding: RE: FYI Blood can be intermittent in the urine and he met the clinical criteria for evaluation particularly with his long smoking history.  It is still recommended that he have the CT and possible cystoscopy.   ----- Message ----- From: Iris Pert, LPN Sent: 01/23/6183  85:92 AM EST To: Irine Seal, MD Subject: FYI                                            Pt canceled Thursday appt. He said Dr. Willey Blade did a urinalysis and no blood was present so he does not see the need to come.

## 2020-12-21 NOTE — Progress Notes (Signed)
Letter faxed to Dr. Alonza Smoker office.

## 2020-12-21 NOTE — Telephone Encounter (Signed)
Please let his PCP know that he has declined evaluation of the hematuria.

## 2020-12-22 ENCOUNTER — Other Ambulatory Visit: Payer: BC Managed Care – PPO

## 2020-12-22 ENCOUNTER — Other Ambulatory Visit: Payer: BC Managed Care – PPO | Admitting: Urology

## 2020-12-22 DIAGNOSIS — Z20822 Contact with and (suspected) exposure to covid-19: Secondary | ICD-10-CM | POA: Diagnosis not present

## 2020-12-24 LAB — SPECIMEN STATUS REPORT

## 2020-12-24 LAB — SARS-COV-2, NAA 2 DAY TAT

## 2020-12-24 LAB — NOVEL CORONAVIRUS, NAA: SARS-CoV-2, NAA: DETECTED — AB

## 2020-12-30 ENCOUNTER — Ambulatory Visit (HOSPITAL_COMMUNITY): Payer: BC Managed Care – PPO

## 2021-02-24 DIAGNOSIS — K5792 Diverticulitis of intestine, part unspecified, without perforation or abscess without bleeding: Secondary | ICD-10-CM | POA: Diagnosis not present

## 2021-02-28 ENCOUNTER — Encounter (HOSPITAL_COMMUNITY): Payer: Self-pay | Admitting: Emergency Medicine

## 2021-02-28 ENCOUNTER — Other Ambulatory Visit: Payer: Self-pay

## 2021-02-28 ENCOUNTER — Emergency Department (HOSPITAL_COMMUNITY): Payer: BC Managed Care – PPO

## 2021-02-28 ENCOUNTER — Emergency Department (HOSPITAL_COMMUNITY)
Admission: EM | Admit: 2021-02-28 | Discharge: 2021-02-28 | Disposition: A | Discharge status: Home / Self Care | Payer: BC Managed Care – PPO | Attending: Emergency Medicine | Admitting: Emergency Medicine

## 2021-02-28 DIAGNOSIS — R197 Diarrhea, unspecified: Secondary | ICD-10-CM | POA: Insufficient documentation

## 2021-02-28 DIAGNOSIS — J45909 Unspecified asthma, uncomplicated: Secondary | ICD-10-CM | POA: Insufficient documentation

## 2021-02-28 DIAGNOSIS — N201 Calculus of ureter: Secondary | ICD-10-CM

## 2021-02-28 DIAGNOSIS — N132 Hydronephrosis with renal and ureteral calculous obstruction: Secondary | ICD-10-CM | POA: Diagnosis not present

## 2021-02-28 DIAGNOSIS — R109 Unspecified abdominal pain: Secondary | ICD-10-CM | POA: Diagnosis not present

## 2021-02-28 DIAGNOSIS — I10 Essential (primary) hypertension: Secondary | ICD-10-CM | POA: Diagnosis not present

## 2021-02-28 DIAGNOSIS — Z87891 Personal history of nicotine dependence: Secondary | ICD-10-CM | POA: Diagnosis not present

## 2021-02-28 DIAGNOSIS — R1032 Left lower quadrant pain: Secondary | ICD-10-CM | POA: Diagnosis not present

## 2021-02-28 HISTORY — DX: Diverticulitis of intestine, part unspecified, without perforation or abscess without bleeding: K57.92

## 2021-02-28 LAB — COMPREHENSIVE METABOLIC PANEL
ALT: 20 U/L (ref 0–44)
AST: 18 U/L (ref 15–41)
Albumin: 4 g/dL (ref 3.5–5.0)
Alkaline Phosphatase: 45 U/L (ref 38–126)
Anion gap: 10 (ref 5–15)
BUN: 26 mg/dL — ABNORMAL HIGH (ref 6–20)
CO2: 24 mmol/L (ref 22–32)
Calcium: 9.1 mg/dL (ref 8.9–10.3)
Chloride: 104 mmol/L (ref 98–111)
Creatinine, Ser: 2.01 mg/dL — ABNORMAL HIGH (ref 0.61–1.24)
GFR, Estimated: 37 mL/min — ABNORMAL LOW (ref >60)
Glucose, Bld: 106 mg/dL — ABNORMAL HIGH (ref 70–99)
Potassium: 4.2 mmol/L (ref 3.5–5.1)
Sodium: 138 mmol/L (ref 135–145)
Total Bilirubin: 0.7 mg/dL (ref 0.3–1.2)
Total Protein: 7.3 g/dL (ref 6.5–8.1)

## 2021-02-28 LAB — CBC WITH DIFFERENTIAL/PLATELET
Abs Immature Granulocytes: 0.02 10*3/uL (ref 0.00–0.07)
Basophils Absolute: 0.1 10*3/uL (ref 0.0–0.1)
Basophils Relative: 1 %
Eosinophils Absolute: 0.2 10*3/uL (ref 0.0–0.5)
Eosinophils Relative: 3 %
HCT: 44.9 % (ref 39.0–52.0)
Hemoglobin: 14.6 g/dL (ref 13.0–17.0)
Immature Granulocytes: 0 %
Lymphocytes Relative: 18 %
Lymphs Abs: 1.3 10*3/uL (ref 0.7–4.0)
MCH: 28.4 pg (ref 26.0–34.0)
MCHC: 32.5 g/dL (ref 30.0–36.0)
MCV: 87.4 fL (ref 80.0–100.0)
Monocytes Absolute: 1 10*3/uL (ref 0.1–1.0)
Monocytes Relative: 14 %
Neutro Abs: 4.8 10*3/uL (ref 1.7–7.7)
Neutrophils Relative %: 64 %
Platelets: 198 10*3/uL (ref 150–400)
RBC: 5.14 MIL/uL (ref 4.22–5.81)
RDW: 12.4 % (ref 11.5–15.5)
WBC: 7.4 10*3/uL (ref 4.0–10.5)
nRBC: 0 % (ref 0.0–0.2)

## 2021-02-28 LAB — URINALYSIS, ROUTINE W REFLEX MICROSCOPIC
Bacteria, UA: NONE SEEN
Bilirubin Urine: NEGATIVE
Glucose, UA: NEGATIVE mg/dL
Hgb urine dipstick: NEGATIVE
Ketones, ur: 5 mg/dL — AB
Nitrite: NEGATIVE
Protein, ur: NEGATIVE mg/dL
Specific Gravity, Urine: 1.023 (ref 1.005–1.030)
pH: 5 (ref 5.0–8.0)

## 2021-02-28 LAB — LIPASE, BLOOD: Lipase: 24 U/L (ref 11–51)

## 2021-02-28 MED ORDER — MORPHINE SULFATE (PF) 4 MG/ML IV SOLN
4.0000 mg | Freq: Once | INTRAVENOUS | Status: AC
  Administered 2021-02-28: 4 mg via INTRAVENOUS
  Filled 2021-02-28: qty 1

## 2021-02-28 MED ORDER — SODIUM CHLORIDE 0.9 % IV BOLUS
1000.0000 mL | Freq: Once | INTRAVENOUS | Status: AC
  Administered 2021-02-28: 1000 mL via INTRAVENOUS

## 2021-02-28 MED ORDER — KETOROLAC TROMETHAMINE 30 MG/ML IJ SOLN
30.0000 mg | Freq: Once | INTRAMUSCULAR | Status: AC
  Administered 2021-02-28: 30 mg via INTRAVENOUS
  Filled 2021-02-28: qty 1

## 2021-02-28 MED ORDER — HYDROCODONE-ACETAMINOPHEN 5-325 MG PO TABS: 1.0000 | ORAL_TABLET | Freq: Four times a day (QID) | ORAL | 0 refills | Status: DC | PRN

## 2021-02-28 MED ORDER — ONDANSETRON HCL 4 MG/2ML IJ SOLN
4.0000 mg | Freq: Once | INTRAMUSCULAR | Status: AC
  Administered 2021-02-28: 4 mg via INTRAVENOUS
  Filled 2021-02-28: qty 2

## 2021-02-28 NOTE — ED Provider Notes (Signed)
[facility] EMERGENCY DEPARTMENT Provider Note   CSN: [id number] Arrival date & time: 02/28/21  0539   History Chief Complaint  Patient presents with  . Flank Pain    [name] [name] is a 60 y.o. male.  The history is provided by the patient.  Flank Pain  He has history of hypertension, hyperlipidemia, prostate cancer and comes in because of pain in the left flank.  He started having some diarrhea 5 days ago, and then developed pain in the left flank which radiated to the left lower abdomen.  Diarrhea stopped that day and he has not had a bowel movement or passed any flatus since then.  There has been some abdominal distention.  He has had nausea and vomiting.  He has tried taking a variety of laxatives and stool softeners and still has not had a bowel movement or passed any flatus.  He still has primary care provider 4 days ago and was diagnosed with diverticulitis and started on metronidazole and ciprofloxacin.  At one point, he did have a temperature which is as high as 102 degrees.  He has been taking ibuprofen for pain.  Ibuprofen will bring the pain level down to 6/10.  When ibuprofen wears off, pain level is at 10/10.  Nothing makes it better, nothing makes it worse.  He does have history of kidney stones but states that this pain is different.  Past Medical History:  Diagnosis Date  . Asthma   . Diverticulitis   . Hypercholesteremia   . Hypertension   . Prostate cancer [facility])     Patient Active Problem List   Diagnosis Date Noted  . PROCTALGIA FUGAX 04/13/2009  . HEMATOCHEZIA 04/11/2009  . ALLERGY 04/11/2009  . CARCINOMA, PROSTATE 12/25/2007  . DYSLIPIDEMIA 12/25/2007  . ASTHMA 12/25/2007    Past Surgical History:  Procedure Laterality Date  . NECK SURGERY    . ROBOT ASSISTED LAPAROSCOPIC RADICAL PROSTATECTOMY  2009  . SPINE SURGERY         History reviewed. No pertinent family history.  Social History   Tobacco Use  . Smoking status: Former Smoker     Packs/day: 2.00    Years: 40.00    Pack years: 80.00    Quit date: 12/03/1979    Years since quitting: 41.2  . Smokeless tobacco: Never Used    Home Medications Prior to Admission medications   Medication Sig Start Date End Date Taking? Authorizing Provider  amLODipine (NORVASC) 5 MG tablet Take 5 mg by mouth daily. 09/16/20   [provider]  sildenafil (REVATIO) 20 MG tablet 1-5 po qday prn 12/02/20   Irine Seal, MD    Allergies    Celecoxib, Penicillins, and Sulfonamide derivatives  Review of Systems   Review of Systems  Genitourinary: Positive for flank pain.  All other systems reviewed and are negative.   Physical Exam Updated Vital Signs BP (!) 151/97 (BP Location: Right Arm)   Pulse 91   Temp 98.4 F (36.9 C) (Oral)   Resp 18   Ht 6\' 1"  (1.854 m)   Wt 109 kg   SpO2 100%   BMI 31.70 kg/m   Physical Exam Vitals and nursing note reviewed.   60 year old male, resting comfortably and in no acute distress. Vital signs are significant for elevated blood pressure. Oxygen saturation is 100%, which is normal. Head is normocephalic and atraumatic. PERRLA, EOMI. Oropharynx is clear. Neck is nontender and supple without adenopathy or JVD. Back is nontender in  the midline.  There is moderate left CVA tenderness. Lungs are clear without rales, wheezes, or rhonchi. Chest is nontender. Heart has regular rate and rhythm without murmur. Abdomen is soft, mildly distended with left lower quadrant tenderness.  There is no rebound or guarding.  There are no masses or hepatosplenomegaly and peristalsis is hypoactive. Extremities have no cyanosis or edema, full range of motion is present. Skin is warm and dry without rash. Neurologic: Mental status is normal, cranial nerves are intact, there are no motor or sensory deficits.  ED Results / Procedures / Treatments   Labs (all labs ordered are listed, but only abnormal results are displayed) Labs Reviewed  CBC WITH  DIFFERENTIAL/PLATELET  COMPREHENSIVE METABOLIC PANEL  LIPASE, BLOOD  URINALYSIS, ROUTINE W REFLEX MICROSCOPIC   Radiology No results found.  Procedures Procedures   Medications Ordered in ED Medications  ondansetron (ZOFRAN) injection 4 mg (has no administration in time range)  morphine 4 MG/ML injection 4 mg (has no administration in time range)    ED Course  I have reviewed the triage vital signs and the nursing notes.  Pertinent labs & imaging results that were available during my care of the patient were reviewed by me and considered in my medical decision making (see chart for details).  MDM Rules/Calculators/A&P Left lower quadrant and left flank pain.  Differential includes conditions with significant morbidity and mortality.  Included in the differential is diverticulitis with failure to respond to outpatient antibiotics, urolithiasis, abdominal aortic aneurysm, pyelonephritis.  He is given morphine for pain, laboratory work-up initiated and he will be sent for renal stone protocol CT scan.  Per my reading CT scan clearly shows a 6 mm proximal left ureteral calculus with hydronephrosis, formal radiologist interpretation pending.  I feel this is what is causing his pain.  I certainly concerned about the report of fever, so we will need to discuss with urology whether he will need urgent stent placement.  Case is signed out to Dr. Rogene Houston.  Final Clinical Impression(s) / ED Diagnoses Final diagnoses:  Ureterolithiasis    Rx / DC Orders ED Discharge Orders    None       Delora Fuel, MD 54/00/86 [phone]

## 2021-02-28 NOTE — ED Triage Notes (Signed)
Pt seen last Thurs by PCP and diagnosed with diverticulitis. Pt has been on antibiotics since then but states he has not gotten any better. States he has not had a B in 6 days. Pt has tried several OTC remedies for this but has not experienced any relief.

## 2021-02-28 NOTE — Discharge Instructions (Addendum)
Discussed with Dr. Tammi Klippel from urology.  They want you to get seen by urology this week.  Call and make an appointment.  If you develop fever again they want you to go to the [facility] emergency department for evaluation.  Work on Arboriculturist.  Call to set up the appointment either here in [place] with urology or at [facility].  Go with the 1 that get you seen quickest.  Take the hydrocodone as needed for breakthrough pain.

## 2021-02-28 NOTE — ED Provider Notes (Signed)
Discussed with Dr. Tammi Klippel from urology.  He gave instructions for patient to get seen immediately at [facility] if fever reoccurs.  Otherwise make arrangements either with [facility] or [facility] urology to get seen this week.  Will give patient some hydrocodone to help with breakthrough pain.  Due to the duration patient's BUN and creatinine is elevated.  The patient feels been hydrating himself well.  But I will give 1 L of normal saline here.  And will give patient IV Toradol.  Patient should be able to be discharged.   Fredia Sorrow, MD 02/28/21 [phone]

## 2021-03-01 ENCOUNTER — Encounter: Payer: Self-pay | Admitting: Urology

## 2021-03-01 ENCOUNTER — Other Ambulatory Visit: Payer: Self-pay

## 2021-03-01 ENCOUNTER — Ambulatory Visit (INDEPENDENT_AMBULATORY_CARE_PROVIDER_SITE_OTHER): Payer: BC Managed Care – PPO | Admitting: Urology

## 2021-03-01 ENCOUNTER — Ambulatory Visit (HOSPITAL_COMMUNITY)
Admission: RE | Admit: 2021-03-01 | Discharge: 2021-03-01 | Disposition: A | Discharge status: Home / Self Care | Payer: BC Managed Care – PPO | Source: Ambulatory Visit | Attending: Urology | Admitting: Urology

## 2021-03-01 VITALS — BP 121/70 | HR 81 | Temp 99.1°F | Ht 72.0 in | Wt 245.0 lb

## 2021-03-01 DIAGNOSIS — N201 Calculus of ureter: Secondary | ICD-10-CM | POA: Diagnosis not present

## 2021-03-01 DIAGNOSIS — N200 Calculus of kidney: Secondary | ICD-10-CM | POA: Diagnosis not present

## 2021-03-01 DIAGNOSIS — N393 Stress incontinence (female) (male): Secondary | ICD-10-CM

## 2021-03-01 LAB — MICROSCOPIC EXAMINATION: Renal Epithel, UA: NONE SEEN /hpf

## 2021-03-01 LAB — URINALYSIS, ROUTINE W REFLEX MICROSCOPIC
Bilirubin, UA: NEGATIVE
Glucose, UA: NEGATIVE
Ketones, UA: NEGATIVE
Nitrite, UA: NEGATIVE
Protein,UA: NEGATIVE
Specific Gravity, UA: 1.025 (ref 1.005–1.030)
Urobilinogen, Ur: 0.2 mg/dL (ref 0.2–1.0)
pH, UA: 5.5 (ref 5.0–7.5)

## 2021-03-01 MED ORDER — TAMSULOSIN HCL 0.4 MG PO CAPS: 0.4000 mg | ORAL_CAPSULE | Freq: Every day | ORAL | 0 refills | Status: DC

## 2021-03-01 MED ORDER — OXYCODONE-ACETAMINOPHEN 5-325 MG PO TABS: 1.0000 | ORAL_TABLET | ORAL | 0 refills | Status: DC | PRN

## 2021-03-01 NOTE — Progress Notes (Signed)
Urological Symptom Review  Patient is experiencing the following symptoms: Frequent urination Leakage of urine Erection problems (male only)  Kidney stones   Review of Systems  Gastrointestinal (upper)  : Nausea Vomiting  Gastrointestinal (lower) : Constipation  Constitutional : Fever  Skin: Negative for skin symptoms  Eyes: Negative for eye symptoms  Ear/Nose/Throat : Sinus problems  Hematologic/Lymphatic: Negative for Hematologic/Lymphatic symptoms  Cardiovascular : Chest pain  Respiratory : Shortness of breath  Endocrine: Negative for endocrine symptoms  Musculoskeletal: Joint pain  Neurological: Negative for neurological symptoms  Psychologic: Negative for psychiatric symptoms

## 2021-03-01 NOTE — Patient Instructions (Signed)

## 2021-03-01 NOTE — Progress Notes (Signed)
03/01/2021 3:04 PM   [name] [name] 1961-02-11 [id number]  Referring provider: Asencion Noble, MD [address] [place],  [place] [address]  Nephrolithiasis  HPI: [name] [name] is a 60yo here for evaluation of nephrolithiasis. Starting last Thursday he developed sharp left abdominal pain that is intermittent and nonraditing. He saw Dr, Willey Blade and was place don cipro and flagyl for diverticulitis. The left abdominal pain worsened and he presented to the ER. He has associated nausea but no vomiting. He presented to the ER yesterday and underwent CT which showed a 73mm left UPJ calculus. He is currently on motrin.    PMH: Past Medical History:  Diagnosis Date  . Asthma   . Diverticulitis   . Hypercholesteremia   . Hypertension   . Prostate cancer [facility])     Surgical History: Past Surgical History:  Procedure Laterality Date  . NECK SURGERY    . ROBOT ASSISTED LAPAROSCOPIC RADICAL PROSTATECTOMY  2009  . SPINE SURGERY      Home Medications:  Allergies as of 03/01/2021      Reactions   Celecoxib    Penicillins    Sulfonamide Derivatives    REACTION: unknown reaction      Medication List       Accurate as of March 01, 2021  3:04 PM. If you have any questions, ask your nurse or doctor.        amLODipine 5 MG tablet Commonly known as: NORVASC Take 5 mg by mouth daily.   ciprofloxacin 750 MG tablet Commonly known as: CIPRO Take 750 mg by mouth 2 (two) times daily.   fluticasone 50 MCG/ACT nasal spray Commonly known as: FLONASE Place 1-2 sprays into both nostrils daily as needed for allergies or rhinitis.   HYDROcodone-acetaminophen 5-325 MG tablet Commonly known as: NORCO/VICODIN Take 1 tablet by mouth every 6 (six) hours as needed for moderate pain.   ibuprofen 200 MG tablet Commonly known as: ADVIL Take 200 mg by mouth every 6 (six) hours as needed for fever, headache or mild pain.   metroNIDAZOLE 500 MG tablet Commonly known as: FLAGYL Take 500 mg by  mouth 3 (three) times daily.   ondansetron 4 MG tablet Commonly known as: ZOFRAN Take 4 mg by mouth every 6 (six) hours as needed for nausea/vomiting.   sildenafil 20 MG tablet Commonly known as: REVATIO 1-5 po qday prn       Allergies:  Allergies  Allergen Reactions  . Celecoxib   . Penicillins   . Sulfonamide Derivatives     REACTION: unknown reaction    Family History: No family history on file.  Social History:  reports that he quit smoking about 41 years ago. He has a 80.00 pack-year smoking history. He has never used smokeless tobacco. No history on file for alcohol use and drug use.  ROS: All other review of systems were reviewed and are negative except what is noted above in HPI  Physical Exam: BP 121/70   Pulse 81   Temp 99.1 F (37.3 C)   Ht 6' (1.829 m)   Wt 245 lb (111.1 kg)   BMI 33.23 kg/m   Constitutional:  Alert and oriented, No acute distress. HEENT: [place] AT, moist mucus membranes.  Trachea midline, no masses. Cardiovascular: No clubbing, cyanosis, or edema. Respiratory: Normal respiratory effort, no increased work of breathing. GI: Abdomen is soft, nontender, nondistended, no abdominal masses GU: No CVA tenderness.  Lymph: No cervical or inguinal lymphadenopathy. Skin: No rashes, bruises or suspicious  lesions. Neurologic: Grossly intact, no focal deficits, moving all 4 extremities. Psychiatric: Normal mood and affect.  Laboratory Data: Lab Results  Component Value Date   WBC 7.4 02/28/2021   HGB 14.6 02/28/2021   HCT 44.9 02/28/2021   MCV 87.4 02/28/2021   PLT 198 02/28/2021    Lab Results  Component Value Date   CREATININE 2.01 (H) 02/28/2021    No results found for: PSA  No results found for: TESTOSTERONE  No results found for: HGBA1C  Urinalysis    Component Value Date/Time   COLORURINE YELLOW 02/28/2021 0631   APPEARANCEUR CLEAR 02/28/2021 0631   APPEARANCEUR Clear 12/02/2020 1459   LABSPEC 1.023 02/28/2021 0631    PHURINE 5.0 02/28/2021 0631   GLUCOSEU NEGATIVE 02/28/2021 0631   HGBUR NEGATIVE 02/28/2021 0631   BILIRUBINUR NEGATIVE 02/28/2021 0631   BILIRUBINUR Negative 12/02/2020 1459   KETONESUR 5 (A) 02/28/2021 0631   PROTEINUR NEGATIVE 02/28/2021 0631   UROBILINOGEN 0.2 05/28/2011 0746   NITRITE NEGATIVE 02/28/2021 0631   LEUKOCYTESUR TRACE (A) 02/28/2021 0631    Lab Results  Component Value Date   LABMICR See below: 12/02/2020   WBCUA None seen 12/02/2020   LABEPIT None seen 12/02/2020   MUCUS Present 12/02/2020   BACTERIA NONE SEEN 02/28/2021    Pertinent Imaging: CT stone study and KUB today: IMages reviewed and discussed with the patient  No results found for this or any previous visit.  No results found for this or any previous visit.  No results found for this or any previous visit.  No results found for this or any previous visit.  No results found for this or any previous visit.  No results found for this or any previous visit.  No results found for this or any previous visit.  Results for orders placed during the hospital encounter of 02/28/21  CT Renal Stone Study  Narrative CLINICAL DATA:  Flank pain.  Kidney stone suspected.  EXAM: CT ABDOMEN AND PELVIS WITHOUT CONTRAST  TECHNIQUE: Multidetector CT imaging of the abdomen and pelvis was performed following the standard protocol without IV contrast.  COMPARISON:  CT angio chest 04/10/2019, CT abdomen pelvis 05/28/2011  FINDINGS: Lower chest: No acute abnormality.  Hepatobiliary: No focal liver abnormality. No gallstones, gallbladder wall thickening, or pericholecystic fluid. No biliary dilatation.  Pancreas: No focal lesion. Normal pancreatic contour. No surrounding inflammatory changes. No main pancreatic ductal dilatation.  Spleen: Normal in size without focal abnormality.  Adrenals/Urinary Tract: No adrenal nodule bilaterally. 1-2 mm calcified stones within the right kidney. 1-2 mm calcified  stone within the left kidney. There is a 7 mm calcified stone at the left ureteropelvic junction with associated proximal mild hydronephrosis. No right ureterolithiasis. No right hydronephrosis. No contour-deforming renal mass. No ureterolithiasis or hydroureter. The urinary bladder is decompressed with trace perivesicular fat stranding.  Stomach/Bowel: Stomach is within normal limits. No evidence of bowel wall thickening or dilatation. Appendix appears normal.  Vascular/Lymphatic: No abdominal aorta or iliac aneurysm. Mild atherosclerotic plaque. Prominent but nonenlarged abdomen lymph nodes. No abdominal, pelvic, or inguinal lymphadenopathy.  Reproductive: The prostate is not definitely identified.  Other: Trace free simple pelvic fluid. No intraperitoneal free gas. No organized fluid collection.  Musculoskeletal:  No abdominal wall hernia or abnormality.  No suspicious lytic or blastic osseous lesions. No acute displaced fracture.  IMPRESSION: 1. Obstructive 7 mm left ureteropelvic junction stone. Correlate with urinalysis for superimposed infection. 2. Nonobstructive bilateral 1-2 mm nephrolithiasis.   Electronically Signed By: Iven Finn  M.D. On: 02/28/2021 07:03   Assessment & Plan:    1. Nephrolithiasis -We discussed the management of kidney stones. These options include observation, ureteroscopy, shockwave lithotripsy (ESWL) and percutaneous nephrolithotomy (PCNL). We discussed which options are relevant to the patient's stone(s). We discussed the natural history of kidney stones as well as the complications of untreated stones and the impact on quality of life without treatment as well as with each of the above listed treatments. We also discussed the efficacy of each treatment in its ability to clear the stone burden. With any of these management options I discussed the signs and symptoms of infection and the need for emergent treatment should these be  experienced. For each option we discussed the ability of each procedure to clear the patient of their stone burden.   For observation I described the risks which include but are not limited to silent renal damage, life-threatening infection, need for emergent surgery, failure to pass stone and pain.   For ureteroscopy I described the risks which include bleeding, infection, damage to contiguous structures, positioning injury, ureteral stricture, ureteral avulsion, ureteral injury, need for prolonged ureteral stent, inability to perform ureteroscopy, need for an interval procedure, inability to clear stone burden, stent discomfort/pain, heart attack, stroke, pulmonary embolus and the inherent risks with general anesthesia.   For shockwave lithotripsy I described the risks which include arrhythmia, kidney contusion, kidney hemorrhage, need for transfusion, pain, inability to adequately break up stone, inability to pass stone fragments, Steinstrasse, infection associated with obstructing stones, need for alternate surgical procedure, need for repeat shockwave lithotripsy, MI, CVA, PE and the inherent risks with anesthesia/conscious sedation.   For PCNL I described the risks including positioning injury, pneumothorax, hydrothorax, need for chest tube, inability to clear stone burden, renal laceration, arterial venous fistula or malformation, need for embolization of kidney, loss of kidney or renal function, need for repeat procedure, need for prolonged nephrostomy tube, ureteral avulsion, MI, CVA, PE and the inherent risks of general anesthesia.   - The patient would like to proceed with left ESWL - Urinalysis, Routine w reflex microscopic   No follow-ups on file.  Nicolette Bang, MD  [facility] [place]

## 2021-03-09 ENCOUNTER — Encounter (HOSPITAL_COMMUNITY): Payer: Self-pay

## 2021-03-09 ENCOUNTER — Encounter (HOSPITAL_COMMUNITY)
Admission: RE | Admit: 2021-03-09 | Discharge: 2021-03-09 | Disposition: A | Discharge status: Home / Self Care | Payer: BC Managed Care – PPO | Source: Ambulatory Visit | Attending: Urology | Admitting: Urology

## 2021-03-09 ENCOUNTER — Other Ambulatory Visit: Payer: Self-pay

## 2021-03-09 HISTORY — DX: Personal history of urinary calculi: Z87.442

## 2021-03-13 ENCOUNTER — Other Ambulatory Visit: Payer: Self-pay

## 2021-03-13 ENCOUNTER — Other Ambulatory Visit (HOSPITAL_COMMUNITY)
Admission: RE | Admit: 2021-03-13 | Discharge: 2021-03-13 | Disposition: A | Discharge status: Home / Self Care | Payer: BC Managed Care – PPO | Source: Ambulatory Visit | Attending: Urology | Admitting: Urology

## 2021-03-13 DIAGNOSIS — E669 Obesity, unspecified: Secondary | ICD-10-CM | POA: Diagnosis not present

## 2021-03-13 DIAGNOSIS — Z880 Allergy status to penicillin: Secondary | ICD-10-CM | POA: Diagnosis not present

## 2021-03-13 DIAGNOSIS — N202 Calculus of kidney with calculus of ureter: Secondary | ICD-10-CM | POA: Diagnosis not present

## 2021-03-13 DIAGNOSIS — Z01812 Encounter for preprocedural laboratory examination: Secondary | ICD-10-CM | POA: Insufficient documentation

## 2021-03-13 DIAGNOSIS — Z20822 Contact with and (suspected) exposure to covid-19: Secondary | ICD-10-CM | POA: Insufficient documentation

## 2021-03-13 DIAGNOSIS — Z882 Allergy status to sulfonamides status: Secondary | ICD-10-CM | POA: Diagnosis not present

## 2021-03-13 DIAGNOSIS — I10 Essential (primary) hypertension: Secondary | ICD-10-CM | POA: Diagnosis not present

## 2021-03-13 DIAGNOSIS — Z87891 Personal history of nicotine dependence: Secondary | ICD-10-CM | POA: Diagnosis not present

## 2021-03-13 DIAGNOSIS — Z6833 Body mass index (BMI) 33.0-33.9, adult: Secondary | ICD-10-CM | POA: Diagnosis not present

## 2021-03-13 LAB — SARS CORONAVIRUS 2 (TAT 6-24 HRS): SARS Coronavirus 2: NEGATIVE

## 2021-03-14 ENCOUNTER — Ambulatory Visit (HOSPITAL_COMMUNITY)
Admission: RE | Admit: 2021-03-14 | Discharge: 2021-03-14 | Disposition: A | Discharge status: Home / Self Care | Payer: BC Managed Care – PPO | Source: Home / Self Care | Attending: Urology | Admitting: Urology

## 2021-03-14 ENCOUNTER — Encounter (HOSPITAL_COMMUNITY)
Admission: RE | Disposition: A | Discharge status: Home / Self Care | Payer: Self-pay | Source: Home / Self Care | Attending: Urology

## 2021-03-14 ENCOUNTER — Ambulatory Visit (HOSPITAL_COMMUNITY)
Admission: RE | Admit: 2021-03-14 | Discharge: 2021-03-14 | Disposition: A | Discharge status: Home / Self Care | Payer: BC Managed Care – PPO | Attending: Urology | Admitting: Urology

## 2021-03-14 DIAGNOSIS — Z882 Allergy status to sulfonamides status: Secondary | ICD-10-CM | POA: Diagnosis not present

## 2021-03-14 DIAGNOSIS — N200 Calculus of kidney: Secondary | ICD-10-CM

## 2021-03-14 DIAGNOSIS — Z20822 Contact with and (suspected) exposure to covid-19: Secondary | ICD-10-CM | POA: Diagnosis not present

## 2021-03-14 DIAGNOSIS — N202 Calculus of kidney with calculus of ureter: Secondary | ICD-10-CM | POA: Insufficient documentation

## 2021-03-14 DIAGNOSIS — E669 Obesity, unspecified: Secondary | ICD-10-CM | POA: Insufficient documentation

## 2021-03-14 DIAGNOSIS — Z87891 Personal history of nicotine dependence: Secondary | ICD-10-CM | POA: Insufficient documentation

## 2021-03-14 DIAGNOSIS — I10 Essential (primary) hypertension: Secondary | ICD-10-CM | POA: Insufficient documentation

## 2021-03-14 DIAGNOSIS — N201 Calculus of ureter: Secondary | ICD-10-CM | POA: Diagnosis not present

## 2021-03-14 DIAGNOSIS — Z880 Allergy status to penicillin: Secondary | ICD-10-CM | POA: Insufficient documentation

## 2021-03-14 DIAGNOSIS — Z6833 Body mass index (BMI) 33.0-33.9, adult: Secondary | ICD-10-CM | POA: Insufficient documentation

## 2021-03-14 HISTORY — PX: EXTRACORPOREAL SHOCK WAVE LITHOTRIPSY: SHX1557

## 2021-03-14 SURGERY — LITHOTRIPSY, ESWL
Anesthesia: LOCAL | Laterality: Left

## 2021-03-14 MED ORDER — SODIUM CHLORIDE 0.9 % IV SOLN: INTRAVENOUS | Status: DC

## 2021-03-14 MED ORDER — ONDANSETRON HCL 4 MG PO TABS: 4.0000 mg | ORAL_TABLET | Freq: Every day | ORAL | 1 refills | Status: DC | PRN

## 2021-03-14 MED ORDER — DIAZEPAM 5 MG PO TABS
ORAL_TABLET | ORAL | Status: AC
  Filled 2021-03-14: qty 2

## 2021-03-14 MED ORDER — DIPHENHYDRAMINE HCL 25 MG PO CAPS
ORAL_CAPSULE | ORAL | Status: AC
  Filled 2021-03-14: qty 1

## 2021-03-14 MED ORDER — DIPHENHYDRAMINE HCL 25 MG PO CAPS
25.0000 mg | ORAL_CAPSULE | ORAL | Status: AC
  Administered 2021-03-14: 25 mg via ORAL

## 2021-03-14 MED ORDER — OXYCODONE-ACETAMINOPHEN 5-325 MG PO TABS: 1.0000 | ORAL_TABLET | ORAL | 0 refills | Status: DC | PRN

## 2021-03-14 MED ORDER — TAMSULOSIN HCL 0.4 MG PO CAPS: 0.4000 mg | ORAL_CAPSULE | Freq: Every day | ORAL | 0 refills | Status: DC

## 2021-03-14 MED ORDER — DIAZEPAM 5 MG PO TABS
10.0000 mg | ORAL_TABLET | Freq: Once | ORAL | Status: AC
  Administered 2021-03-14: 10 mg via ORAL

## 2021-03-14 NOTE — Discharge Instructions (Signed)
PATIENT INSTRUCTIONS POST-ANESTHESIA  IMMEDIATELY FOLLOWING SURGERY:  Do not drive or operate machinery for the first twenty four hours after surgery.  Do not make any important decisions for twenty four hours after surgery or while taking narcotic pain medications or sedatives.  If you develop intractable nausea and vomiting or a severe headache please notify your doctor immediately.  FOLLOW-UP:  Please make an appointment with your surgeon as instructed. You do not need to follow up with anesthesia unless specifically instructed to do so.  WOUND CARE INSTRUCTIONS (if applicable):  Keep a dry clean dressing on the anesthesia/puncture wound site if there is drainage.  Once the wound has quit draining you may leave it open to air.  Generally you should leave the bandage intact for twenty four hours unless there is drainage.  If the epidural site drains for more than 36-48 hours please call the anesthesia department.  QUESTIONS?:  Please feel free to call your physician or the hospital operator if you have any questions, and they will be happy to assist you.      Lithotripsy, Care After This sheet gives you information about how to care for yourself after your procedure. Your health care provider may also give you more specific instructions. If you have problems or questions, contact your health care provider. What can I expect after the procedure? After the procedure, it is common to have:  Some blood in your urine. This should only last for a few days.  Soreness in your back, sides, or upper abdomen for a few days.  Blotches or bruises on the area where the shock wave entered the skin.  Pain, discomfort, or nausea when pieces (fragments) of the kidney stone move through the tube that carries urine from the kidney to the bladder (ureter). Stone fragments may pass soon after the procedure, but they may continue to pass for up to 4-8 weeks. ? If you have severe pain or nausea, contact your  health care provider. This may be caused by a large stone that was not broken up, and this may mean that you need more treatment.  Some pain or discomfort during urination.  Some pain or discomfort in the lower abdomen or (in men) at the base of the penis. Follow these instructions at home: Medicines  Take over-the-counter and prescription medicines only as told by your health care provider.  If you were prescribed an antibiotic medicine, take it as told by your health care provider. Do not stop taking the antibiotic even if you start to feel better.  Ask your health care provider if the medicine prescribed to you requires you to avoid driving or using machinery. Eating and drinking  Drink enough fluid to keep your urine pale yellow. This helps any remaining pieces of the stone to pass. It can also help prevent new stones from forming.  Eat plenty of fresh fruits and vegetables.  Follow instructions from your health care provider about eating or drinking restrictions. You may be instructed to: ? Reduce how much salt (sodium) you eat or drink. Check ingredients and nutrition facts on packaged foods and beverages to see how much sodium they contain. ? Reduce how much meat you eat.  Eat the recommended amount of calcium for your age and gender. Ask your health care provider how much calcium you should have.      General instructions  Get plenty of rest.  Return to your normal activities as told by your health care provider. Ask your health  care provider what activities are safe for you. Most people can resume normal activities 1-2 days after the procedure.  If you were given a sedative during the procedure, it can affect you for several hours. Do not drive or operate machinery until your health care provider says that it is safe.  Your health care provider may direct you to lie in a certain position (postural drainage) and tap firmly (percuss) over your kidney area to help stone  fragments pass. Follow instructions as told by your health care provider.  If directed, strain all urine through the strainer that was provided by your health care provider. ? Keep all fragments for your health care provider to see. Any stones that are found may be sent to a medical lab for examination. The stone may be as small as a grain of salt.  Keep all follow-up visits as told by your health care provider. This is important. Contact a health care provider if:  You have a fever or chills.  You have nausea that is severe or does not go away.  You have any of these urinary symptoms: ? Blood in your urine for longer than your health care provider told you to expect. ? Urine that smells bad or unusual. ? Feeling a strong urge to urinate after emptying your bladder. ? Pain or burning with urination that does not go away. ? Urinating more often than usual and this does not go away.  You have a stent and it comes out. Get help right away if:  You have severe pain in your back, sides, or upper abdomen.  You have any of these urinary symptoms: ? Severe pain while urinating. ? More blood in your urine or having blood in your urine when you did not before. ? Passing blood clots in your urine. ? Passing only a small amount of urine or being unable to pass any urine at all.  You have severe nausea that leads to persistent vomiting.  You faint. Summary  After this procedure, it is common to have some pain, discomfort, or nausea when pieces (fragments) of the kidney stone move through the tube that carries urine from the kidney to the bladder (ureter). If this pain or nausea is severe, however, you should contact your health care provider.  Return to your normal activities as told by your health care provider. Ask your health care provider what activities are safe for you.  Drink enough fluid to keep your urine pale yellow. This helps any remaining pieces of the stone to pass, and it can  help prevent new stones from forming.  If directed, strain your urine and keep all fragments for your health care provider to see. Fragments or stones may be as small as a grain of salt.  Get help right away if you have severe pain in your back, sides, or upper abdomen, or if you have severe pain while urinating. This information is not intended to replace advice given to you by your health care provider. Make sure you discuss any questions you have with your health care provider. Document Revised: 09/16/2019 Document Reviewed: 09/16/2019 Elsevier Patient Education  [place].

## 2021-03-20 ENCOUNTER — Encounter (HOSPITAL_COMMUNITY): Payer: Self-pay | Admitting: Urology

## 2021-03-20 ENCOUNTER — Other Ambulatory Visit: Payer: Self-pay

## 2021-03-20 ENCOUNTER — Telehealth: Payer: Self-pay

## 2021-03-20 ENCOUNTER — Other Ambulatory Visit: Payer: BC Managed Care – PPO

## 2021-03-20 DIAGNOSIS — N390 Urinary tract infection, site not specified: Secondary | ICD-10-CM

## 2021-03-20 LAB — URINALYSIS, ROUTINE W REFLEX MICROSCOPIC
Bilirubin, UA: NEGATIVE
Glucose, UA: NEGATIVE
Ketones, UA: NEGATIVE
Leukocytes,UA: NEGATIVE
Nitrite, UA: NEGATIVE
Protein,UA: NEGATIVE
Specific Gravity, UA: 1.015 (ref 1.005–1.030)
Urobilinogen, Ur: 0.2 mg/dL (ref 0.2–1.0)
pH, UA: 5.5 (ref 5.0–7.5)

## 2021-03-20 LAB — MICROSCOPIC EXAMINATION
Bacteria, UA: NONE SEEN
Epithelial Cells (non renal): NONE SEEN /hpf (ref 0–10)
Renal Epithel, UA: NONE SEEN /hpf
WBC, UA: NONE SEEN /hpf (ref 0–5)

## 2021-03-20 NOTE — Telephone Encounter (Signed)
Patient needing a call back on his urine results from this morning.  Please call back at: [phone]  Thanks, Helene Kelp

## 2021-03-20 NOTE — Telephone Encounter (Signed)
Message sent

## 2021-03-21 NOTE — Telephone Encounter (Signed)
-----   Message from Cleon Gustin, MD sent at 03/21/2021 10:15 AM EDT ----- normal ----- Message ----- From: Valentina Lucks, LPN Sent: 03/22/9506   1:51 PM EDT To: Cleon Gustin, MD  Pls advise.

## 2021-03-21 NOTE — Telephone Encounter (Signed)
Pt notified of ua results and his f/u appointment was rescheduled because he was going out of town.

## 2021-03-22 LAB — CULTURE, URINE COMPREHENSIVE

## 2021-03-27 ENCOUNTER — Ambulatory Visit (INDEPENDENT_AMBULATORY_CARE_PROVIDER_SITE_OTHER): Payer: Self-pay | Admitting: *Deleted

## 2021-03-27 ENCOUNTER — Other Ambulatory Visit: Payer: Self-pay

## 2021-03-27 VITALS — Ht 72.0 in | Wt 246.6 lb

## 2021-03-27 DIAGNOSIS — Z1211 Encounter for screening for malignant neoplasm of colon: Secondary | ICD-10-CM

## 2021-03-27 MED ORDER — PEG 3350-KCL-NA BICARB-NACL 420 G PO SOLR: 4000.0000 mL | Freq: Once | ORAL | 0 refills | Status: AC

## 2021-03-27 NOTE — Progress Notes (Addendum)
Gastroenterology Pre-Procedure Review  Request Date: 03/27/2021 Requesting Physician: 10 year recall, Last TCS 05/25/2010 done by Dr. Gala Romney, hyperplastic polyps  PATIENT REVIEW QUESTIONS: The patient responded to the following health history questions as indicated:    1. Diabetes Melitis: no 2. Joint replacements in the past 12 months: no 3. Major health problems in the past 3 months: yes, kidney stones (lithotripsy 03/14/2021) 4. Has an artificial valve or MVP: no 5. Has a defibrillator: no 6. Has been advised in past to take antibiotics in advance of a procedure like teeth cleaning: no 7. Family history of colon cancer: no  8. Alcohol Use: yes, 4 beers a week 9. Illicit drug Use: no 10. History of sleep apnea: no  11. History of coronary artery or other vascular stents placed within the last 12 months: no 12. History of any prior anesthesia complications: no 13. Body mass index is 33.44 kg/m.    MEDICATIONS & ALLERGIES:    Patient reports the following regarding taking any blood thinners:   Plavix? no Aspirin? no Coumadin? no Brilinta? no Xarelto? no Eliquis? no Pradaxa? no Savaysa? no Effient? no  Patient confirms/reports the following medications:  Current Outpatient Medications  Medication Sig Dispense Refill  . amLODipine (NORVASC) 5 MG tablet Take 5 mg by mouth daily.    . fluticasone (FLONASE) 50 MCG/ACT nasal spray Place 1-2 sprays into both nostrils daily.    . sildenafil (REVATIO) 20 MG tablet 1-5 po qday prn 90 tablet 5   No current facility-administered medications for this visit.    Patient confirms/reports the following allergies:  Allergies  Allergen Reactions  . Celecoxib     Pt unsure   . Penicillins     Childhood   . Sulfonamide Derivatives     REACTION: unknown reaction    No orders of the defined types were placed in this encounter.   AUTHORIZATION INFORMATION Primary Insurance: [place],  [place] [id number] ,  Group #: [id number] Pre-Cert /  Josem Kaufmann required: No, not required  SCHEDULE INFORMATION: Procedure has been scheduled as follows:  Date: 04/26/2021, Time: 2:00 Location: [facility] with Dr. Gala Romney  This Gastroenterology Pre-Precedure Review Form is being routed to the following provider(s): Roseanne Kaufman, NP

## 2021-03-27 NOTE — Patient Instructions (Addendum)
Covid test: 04/24/2021 at 3:45 PM.    [name] [name]   [birth year] MRN: [id number] Procedure Date: 04/26/2021  Arrival Time:  1:00 PM     Location of Procedure: [facility]  PREPARATION FOR COLONOSCOPY WITH TRI-LYTE PREP   Please notify us immediately if you are diabetic, take iron supplements, or if you are on Coumadin or any other blood thinners.   Please hold the following medications: n/a  You will need to purchase 1 fleet enema and 1 box of Bisacodyl 5mg  tablets.   PROCEDURE IS SCHEDULED FOR [name] [name] AS FOLLOWS:  Procedure Date: 04/26/2021 Time to register: 1:00 PM Place to register: [facility] Scheduled provider: Dr. Gala Romney   2 DAYS BEFORE PROCEDURE:  DATE: 04/24/2021   DAY:  Monday Begin clear liquid diet AFTER your lunch meal. NO SOLID FOODS!   1 DAY BEFORE PROCEDURE:  DATE: 04/25/2021   DAY: Tuesday  Continue clear liquids the entire day - NO SOLID FOOD.   Diabetic medications adjustments for today: n/a  At 12:00pm (noon): Take 2 (two) Dulcolax (Bisacodyl) tablets  At 2:00pm: Start drinking your solution. Try to drink 1 (one) 8 ounce glass every 10-15 minutes, until you have consumed HALF the jug. (You should complete the first 1/2 of the jug in 2 hours. Wait 30 minutes, then drink 3-4 more glasses of the solution. Your stools should be clear; if not, you may have to consume the rest of the jug.   One hour after completing the solution: take the last 2 (two) Dulcolax (Bisacodyl) tablets, with a clear liquid.  YOU MUST DRINK PLENTY OF CLEAR LIQUIDS DURING YOUR PREP TO REDUCE RISKS OF KIDNEY FAILURE.   Continue clear liquids only, until 11:00 AM.  Nothing by mouth after 11:00 AM the day of procedure.    EXCEPTION:  If you take medications for your heart, blood pressure or breathing, you may take these medications with a small amount of clear liquid.      DAY OF PROCEDURE:   DATE:  04/26/2021     DAY: Wednesday The morning of your  procedure give yourself 1 (one) Fleet Enema, at least 1 hour before going to the hospital.   You may take Tylenol products. Please continue your regular medications unless we have instructed otherwise.   Diabetic medications adjustments for today. n/a  Someone MUST be available to drive you home; the hospital will cancel this appointment if you do not have a driver.   Please call the office if you have any questions (Dept: [phone]).  Please see below for Dietary Information.  CLEAR LIQUIDS INCLUDE:  Water Jello (NOT red in color)   Ice Popsicles (NOT red in color)   Tea (sugar ok, no milk/cream) Powdered fruit flavored drinks  Coffee (sugar ok, no milk/cream) Gatorade/ Lemonade/ Kool-Aid  (NOT red in color)   Juice: apple, white grape, white cranberry Soft drinks  Clear bullion, consomme, broth (fat free beef/chicken/vegetable)  Carbonated beverages (any kind)  Strained chicken noodle soup Hard Candy   REMEMBER: Clear liquids are liquids that will allow you to see your fingers on the other side of a clear glass. Be sure liquids are NOT red in color, and not cloudy, but CLEAR.   DO NOT EAT OR DRINK ANY OF THE FOLLOWING:  Dairy products of any kind   Cranberry juice Tomato juice / V8 juice   Grapefruit juice Orange juice     Red grape juice  Do not eat  any solid foods, including such foods as: cereal, oatmeal, yogurt, fruits, vegetables, creamed soups, eggs, bread, etc.    HELPFUL HINTS FOR DRINKING PREP SOLUTION:   Make sure prep is extremely cold. Refrigerate the night before. You may also put in the freezer.   You may try mixing some Crystal Light or Country Time Lemonade if you prefer. Mix in small amounts; add more if necessary.  Try drinking through a straw  Rinse mouth with water or a mouthwash between glasses, to remove after-taste.  Try sipping on a cold beverage /ice/ popsicles between glasses of prep  Place a piece of sugar-free hard candy in mouth between  glasses  If you become nauseated, try consuming smaller amounts, or stretch out the time between glasses. Stop for 30-60 minutes, then slowly start back drinking    You may call the office (Dept: [phone]) before 5:00pm, or page the doctor on call after 5:00pm [phone]), for further instructions, if necessary.   OTHER INSTRUCTIONS  You will need a responsible adult at least 61 years of age to accompany you and drive you home. This person must remain in the waiting room during your procedure.  Wear loose fitting clothing that is easily removed.  Leave jewelry and other valuables at home.   Remove all body piercing jewelry and leave at home.  Total time from sign-in until discharge is approximately 2-3 hours.  You should go home directly after your procedure and rest. You can resume normal activities the day after your procedure.  The day of your procedure you should not:  Drive  Make legal decisions  Operate machinery  Drink alcohol  Return to work

## 2021-03-31 ENCOUNTER — Other Ambulatory Visit: Payer: Self-pay | Admitting: Urology

## 2021-03-31 DIAGNOSIS — N200 Calculus of kidney: Secondary | ICD-10-CM

## 2021-04-03 NOTE — Progress Notes (Signed)
Needs Propofol due to Versed 5 and Demerol 125 during last procedure.

## 2021-04-05 NOTE — Progress Notes (Signed)
Spoke with pt.  Informed him that he needed an ov with a provider to arrange TCS due to last procedure (increased sedation).  Pt voiced understanding and scheduled ov for 04/18/2021 at 10:30 with Roseanne Kaufman, NP.  Pt made aware that we are going to cancel his procedure and Covid screening and will reschedule at his ov.

## 2021-04-07 ENCOUNTER — Ambulatory Visit: Payer: BC Managed Care – PPO | Admitting: Urology

## 2021-04-10 ENCOUNTER — Telehealth: Payer: Self-pay

## 2021-04-10 NOTE — Telephone Encounter (Signed)
LMOM for pt to call us back.  

## 2021-04-10 NOTE — Telephone Encounter (Signed)
VM received today 04/10/21. Pt called on 04/05/2021. Pt would like to discuss getting a smaller bottle of prep for his procedure as compared to the larger bottle. Pt would like to know if insurance will cover a smaller bottle.

## 2021-04-11 NOTE — H&P (Signed)
HPI: Mr Carton is a 60yo here for evaluation of nephrolithiasis. Starting last Thursday he developed sharp left abdominal pain that is intermittent and nonraditing. He saw Dr, Willey Blade and was place don cipro and flagyl for diverticulitis. The left abdominal pain worsened and he presented to the ER. He has associated nausea but no vomiting. He presented to the ER yesterday and underwent CT which showed a 100mm left UPJ calculus. He is currently on motrin.    PMH:     Past Medical History:  Diagnosis Date  . Asthma   . Diverticulitis   . Hypercholesteremia   . Hypertension   . Prostate cancer [facility])     Surgical History:      Past Surgical History:  Procedure Laterality Date  . NECK SURGERY    . ROBOT ASSISTED LAPAROSCOPIC RADICAL PROSTATECTOMY  2009  . SPINE SURGERY      Home Medications:       Allergies as of 03/01/2021      Reactions   Celecoxib    Penicillins    Sulfonamide Derivatives    REACTION: unknown reaction         Medication List       Accurate as of March 01, 2021  3:04 PM. If you have any questions, ask your nurse or doctor.        amLODipine 5 MG tablet Commonly known as: NORVASC Take 5 mg by mouth daily.   ciprofloxacin 750 MG tablet Commonly known as: CIPRO Take 750 mg by mouth 2 (two) times daily.   fluticasone 50 MCG/ACT nasal spray Commonly known as: FLONASE Place 1-2 sprays into both nostrils daily as needed for allergies or rhinitis.   HYDROcodone-acetaminophen 5-325 MG tablet Commonly known as: NORCO/VICODIN Take 1 tablet by mouth every 6 (six) hours as needed for moderate pain.   ibuprofen 200 MG tablet Commonly known as: ADVIL Take 200 mg by mouth every 6 (six) hours as needed for fever, headache or mild pain.   metroNIDAZOLE 500 MG tablet Commonly known as: FLAGYL Take 500 mg by mouth 3 (three) times daily.   ondansetron 4 MG tablet Commonly known as: ZOFRAN Take 4 mg by mouth every 6 (six)  hours as needed for nausea/vomiting.   sildenafil 20 MG tablet Commonly known as: REVATIO 1-5 po qday prn       Allergies:       Allergies  Allergen Reactions  . Celecoxib   . Penicillins   . Sulfonamide Derivatives     REACTION: unknown reaction    Family History: No family history on file.  Social History:  reports that he quit smoking about 41 years ago. He has a 80.00 pack-year smoking history. He has never used smokeless tobacco. No history on file for alcohol use and drug use.  ROS: All other review of systems were reviewed and are negative except what is noted above in HPI  Physical Exam: BP 121/70   Pulse 81   Temp 99.1 F (37.3 C)   Ht 6' (1.829 m)   Wt 245 lb (111.1 kg)   BMI 33.23 kg/m   Constitutional:  Alert and oriented, No acute distress. HEENT: [place] AT, moist mucus membranes.  Trachea midline, no masses. Cardiovascular: No clubbing, cyanosis, or edema. Respiratory: Normal respiratory effort, no increased work of breathing. GI: Abdomen is soft, nontender, nondistended, no abdominal masses GU: No CVA tenderness.  Lymph: No cervical or inguinal lymphadenopathy. Skin: No rashes, bruises or suspicious lesions. Neurologic: Grossly intact, no focal  deficits, moving all 4 extremities. Psychiatric: Normal mood and affect.  Laboratory Data: Recent Labs       Lab Results  Component Value Date   WBC 7.4 02/28/2021   HGB 14.6 02/28/2021   HCT 44.9 02/28/2021   MCV 87.4 02/28/2021   PLT 198 02/28/2021      Recent Labs       Lab Results  Component Value Date   CREATININE 2.01 (H) 02/28/2021      Recent Labs  No results found for: PSA    Recent Labs  No results found for: TESTOSTERONE    Recent Labs  No results found for: HGBA1C    Urinalysis Labs (Brief)          Component Value Date/Time   COLORURINE YELLOW 02/28/2021 0631   APPEARANCEUR CLEAR 02/28/2021 0631   APPEARANCEUR Clear 12/02/2020 1459    LABSPEC 1.023 02/28/2021 0631   PHURINE 5.0 02/28/2021 0631   GLUCOSEU NEGATIVE 02/28/2021 0631   HGBUR NEGATIVE 02/28/2021 0631   BILIRUBINUR NEGATIVE 02/28/2021 0631   BILIRUBINUR Negative 12/02/2020 1459   KETONESUR 5 (A) 02/28/2021 0631   PROTEINUR NEGATIVE 02/28/2021 0631   UROBILINOGEN 0.2 05/28/2011 0746   NITRITE NEGATIVE 02/28/2021 0631   LEUKOCYTESUR TRACE (A) 02/28/2021 0631      Recent Labs       Lab Results  Component Value Date   LABMICR See below: 12/02/2020   WBCUA None seen 12/02/2020   LABEPIT None seen 12/02/2020   MUCUS Present 12/02/2020   BACTERIA NONE SEEN 02/28/2021      Pertinent Imaging: CT stone study and KUB today: IMages reviewed and discussed with the patient  No results found for this or any previous visit.  No results found for this or any previous visit.  No results found for this or any previous visit.  No results found for this or any previous visit.  No results found for this or any previous visit.  No results found for this or any previous visit.  No results found for this or any previous visit.  Results for orders placed during the hospital encounter of 02/28/21  CT Renal Stone Study  Narrative CLINICAL DATA:  Flank pain.  Kidney stone suspected.  EXAM: CT ABDOMEN AND PELVIS WITHOUT CONTRAST  TECHNIQUE: Multidetector CT imaging of the abdomen and pelvis was performed following the standard protocol without IV contrast.  COMPARISON:  CT angio chest 04/10/2019, CT abdomen pelvis 05/28/2011  FINDINGS: Lower chest: No acute abnormality.  Hepatobiliary: No focal liver abnormality. No gallstones, gallbladder wall thickening, or pericholecystic fluid. No biliary dilatation.  Pancreas: No focal lesion. Normal pancreatic contour. No surrounding inflammatory changes. No main pancreatic ductal dilatation.  Spleen: Normal in size without focal abnormality.  Adrenals/Urinary Tract: No  adrenal nodule bilaterally. 1-2 mm calcified stones within the right kidney. 1-2 mm calcified stone within the left kidney. There is a 7 mm calcified stone at the left ureteropelvic junction with associated proximal mild hydronephrosis. No right ureterolithiasis. No right hydronephrosis. No contour-deforming renal mass. No ureterolithiasis or hydroureter. The urinary bladder is decompressed with trace perivesicular fat stranding.  Stomach/Bowel: Stomach is within normal limits. No evidence of bowel wall thickening or dilatation. Appendix appears normal.  Vascular/Lymphatic: No abdominal aorta or iliac aneurysm. Mild atherosclerotic plaque. Prominent but nonenlarged abdomen lymph nodes. No abdominal, pelvic, or inguinal lymphadenopathy.  Reproductive: The prostate is not definitely identified.  Other: Trace free simple pelvic fluid. No intraperitoneal free gas. No organized fluid collection.  Musculoskeletal:  No abdominal wall hernia or abnormality.  No suspicious lytic or blastic osseous lesions. No acute displaced fracture.  IMPRESSION: 1. Obstructive 7 mm left ureteropelvic junction stone. Correlate with urinalysis for superimposed infection. 2. Nonobstructive bilateral 1-2 mm nephrolithiasis.   Electronically Signed By: Iven Finn M.D. On: 02/28/2021 07:03   Assessment & Plan:    1. Nephrolithiasis -We discussed the management of kidney stones. These options include observation, ureteroscopy, shockwave lithotripsy (ESWL) and percutaneous nephrolithotomy (PCNL). We discussed which options are relevant to the patient's stone(s). We discussed the natural history of kidney stones as well as the complications of untreated stones and the impact on quality of life without treatment as well as with each of the above listed treatments. We also discussed the efficacy of each treatment in its ability to clear the stone burden. With any of these management options  I discussed the signs and symptoms of infection and the need for emergent treatment should these be experienced. For each option we discussed the ability of each procedure to clear the patient of their stone burden.   For observation I described the risks which include but are not limited to silent renal damage, life-threatening infection, need for emergent surgery, failure to pass stone and pain.   For ureteroscopy I described the risks which include bleeding, infection, damage to contiguous structures, positioning injury, ureteral stricture, ureteral avulsion, ureteral injury, need for prolonged ureteral stent, inability to perform ureteroscopy, need for an interval procedure, inability to clear stone burden, stent discomfort/pain, heart attack, stroke, pulmonary embolus and the inherent risks with general anesthesia.   For shockwave lithotripsy I described the risks which include arrhythmia, kidney contusion, kidney hemorrhage, need for transfusion, pain, inability to adequately break up stone, inability to pass stone fragments, Steinstrasse, infection associated with obstructing stones, need for alternate surgical procedure, need for repeat shockwave lithotripsy, MI, CVA, PE and the inherent risks with anesthesia/conscious sedation.   For PCNL I described the risks including positioning injury, pneumothorax, hydrothorax, need for chest tube, inability to clear stone burden, renal laceration, arterial venous fistula or malformation, need for embolization of kidney, loss of kidney or renal function, need for repeat procedure, need for prolonged nephrostomy tube, ureteral avulsion, MI, CVA, PE and the inherent risks of general anesthesia.   - The patient would like to proceed with left ESWL

## 2021-04-12 ENCOUNTER — Telehealth (INDEPENDENT_AMBULATORY_CARE_PROVIDER_SITE_OTHER): Payer: BC Managed Care – PPO | Admitting: Urology

## 2021-04-12 ENCOUNTER — Other Ambulatory Visit: Payer: Self-pay

## 2021-04-12 ENCOUNTER — Ambulatory Visit (HOSPITAL_COMMUNITY)
Admission: RE | Admit: 2021-04-12 | Discharge: 2021-04-12 | Disposition: A | Discharge status: Home / Self Care | Payer: BC Managed Care – PPO | Source: Ambulatory Visit | Attending: Urology | Admitting: Urology

## 2021-04-12 ENCOUNTER — Encounter: Payer: Self-pay | Admitting: Urology

## 2021-04-12 DIAGNOSIS — N200 Calculus of kidney: Secondary | ICD-10-CM | POA: Insufficient documentation

## 2021-04-12 DIAGNOSIS — Z87442 Personal history of urinary calculi: Secondary | ICD-10-CM | POA: Diagnosis not present

## 2021-04-12 MED ORDER — TAMSULOSIN HCL 0.4 MG PO CAPS: 0.4000 mg | ORAL_CAPSULE | Freq: Every day | ORAL | 0 refills | Status: DC

## 2021-04-12 NOTE — Patient Instructions (Signed)

## 2021-04-12 NOTE — Progress Notes (Signed)
04/12/2021 1:44 PM   [name] [name] 01-04-1961 [id number]  Referring provider: Asencion Noble, MD [address] [place],  [place] [address]   Patient location: home Physician location: office I connected with  [name] [name] on 04/25/21 by a video enabled telemedicine application and verified that I am speaking with the correct person using two identifiers.   I discussed the limitations of evaluation and management by telemedicine. The patient expressed understanding and agreed to proceed.    nephrolithiasis  HPI: [name] [name] is a 60yo here for followup for left nephrolithiasis. He passed multiple small fragments the first 2 weeks after ESWL. He feels his urine stream is weaker and he has to stain to urinate. He had a prostatectomy. He has mild intermittent left flank pain. KUB does not show a definitive calculus   PMH: Past Medical History:  Diagnosis Date  . Asthma    as child  . Diverticulitis   . History of kidney stones   . Hypercholesteremia   . Hypertension   . Prostate cancer [facility])     Surgical History: Past Surgical History:  Procedure Laterality Date  . EXTRACORPOREAL SHOCK WAVE LITHOTRIPSY Left 03/14/2021   Procedure: EXTRACORPOREAL SHOCK WAVE LITHOTRIPSY (ESWL);  Surgeon: Cleon Gustin, MD;  Location: [facility];  Service: Urology;  Laterality: Left;  . NECK SURGERY    . ROBOT ASSISTED LAPAROSCOPIC RADICAL PROSTATECTOMY  2009  . SPINE SURGERY      Home Medications:  Allergies as of 04/12/2021      Reactions   Celecoxib    Pt unsure    Penicillins    Childhood    Sulfonamide Derivatives    REACTION: unknown reaction      Medication List       Accurate as of April 12, 2021  1:44 PM. If you have any questions, ask your nurse or doctor.        amLODipine 5 MG tablet Commonly known as: NORVASC Take 5 mg by mouth daily.   fluticasone 50 MCG/ACT nasal spray Commonly known as: FLONASE Place 1-2 sprays into both nostrils daily.    sildenafil 20 MG tablet Commonly known as: REVATIO 1-5 po qday prn   tamsulosin 0.4 MG Caps capsule Commonly known as: FLOMAX TAKE 1 CAPSULE(0.4 MG) BY MOUTH DAILY AFTER SUPPER       Allergies:  Allergies  Allergen Reactions  . Celecoxib     Pt unsure   . Penicillins     Childhood   . Sulfonamide Derivatives     REACTION: unknown reaction    Family History: History reviewed. No pertinent family history.  Social History:  reports that he quit smoking about 41 years ago. He has a 80.00 pack-year smoking history. He has never used smokeless tobacco. He reports current alcohol use of about 2.0 standard drinks of alcohol per week. He reports previous drug use.  ROS: All other review of systems were reviewed and are negative except what is noted above in HPI  Physical Exam: There were no vitals taken for this visit.  Constitutional:  Alert and oriented, No acute distress. HEENT: [place] AT, moist mucus membranes.  Trachea midline, no masses. Cardiovascular: No clubbing, cyanosis, or edema. Respiratory: Normal respiratory effort, no increased work of breathing. GI: Abdomen is soft, nontender, nondistended, no abdominal masses GU: No CVA tenderness.  Lymph: No cervical or inguinal lymphadenopathy. Skin: No rashes, bruises or suspicious lesions. Neurologic: Grossly intact, no focal deficits, moving all 4 extremities. Psychiatric: Normal mood  and affect.  Laboratory Data: Lab Results  Component Value Date   WBC 7.4 02/28/2021   HGB 14.6 02/28/2021   HCT 44.9 02/28/2021   MCV 87.4 02/28/2021   PLT 198 02/28/2021    Lab Results  Component Value Date   CREATININE 2.01 (H) 02/28/2021    No results found for: PSA  No results found for: TESTOSTERONE  No results found for: HGBA1C  Urinalysis    Component Value Date/Time   COLORURINE YELLOW 02/28/2021 0631   APPEARANCEUR Clear 03/20/2021 0933   LABSPEC 1.023 02/28/2021 0631   PHURINE 5.0 02/28/2021 0631   GLUCOSEU  Negative 03/20/2021 0933   HGBUR NEGATIVE 02/28/2021 0631   BILIRUBINUR Negative 03/20/2021 0933   KETONESUR 5 (A) 02/28/2021 0631   PROTEINUR Negative 03/20/2021 0933   PROTEINUR NEGATIVE 02/28/2021 0631   UROBILINOGEN 0.2 05/28/2011 0746   NITRITE Negative 03/20/2021 0933   NITRITE NEGATIVE 02/28/2021 0631   LEUKOCYTESUR Negative 03/20/2021 0933   LEUKOCYTESUR TRACE (A) 02/28/2021 0631    Lab Results  Component Value Date   LABMICR See below: 03/20/2021   WBCUA None seen 03/20/2021   LABEPIT None seen 03/20/2021   MUCUS Present 03/01/2021   BACTERIA None seen 03/20/2021    Pertinent Imaging:  Results for orders placed in visit on 03/01/21  Abdomen 1 view (KUB)  Narrative CLINICAL DATA:  Nephrolithiasis, recent LEFT side kidney stone  EXAM: ABDOMEN - 1 VIEW  COMPARISON:  CT abdomen and pelvis 02/28/2021  FINDINGS: 7 x 5 mm calculus at expected position of LEFT renal pelvis/ureteropelvic junction.  No additional urinary tract calcifications.  Bowel gas pattern normal.  Osseous structures unremarkable.  IMPRESSION: Again identified 7 x 5 mm diameter calculus projecting over the expected position of the LEFT renal pelvis/ureteropelvic junction.   Electronically Signed By: Lavonia Dana M.D. On: 03/03/2021 09:55  No results found for this or any previous visit.  No results found for this or any previous visit.  No results found for this or any previous visit.  No results found for this or any previous visit.  No results found for this or any previous visit.  No results found for this or any previous visit.  Results for orders placed during the hospital encounter of 02/28/21  CT Renal Stone Study  Narrative CLINICAL DATA:  Flank pain.  Kidney stone suspected.  EXAM: CT ABDOMEN AND PELVIS WITHOUT CONTRAST  TECHNIQUE: Multidetector CT imaging of the abdomen and pelvis was performed following the standard protocol without IV  contrast.  COMPARISON:  CT angio chest 04/10/2019, CT abdomen pelvis 05/28/2011  FINDINGS: Lower chest: No acute abnormality.  Hepatobiliary: No focal liver abnormality. No gallstones, gallbladder wall thickening, or pericholecystic fluid. No biliary dilatation.  Pancreas: No focal lesion. Normal pancreatic contour. No surrounding inflammatory changes. No main pancreatic ductal dilatation.  Spleen: Normal in size without focal abnormality.  Adrenals/Urinary Tract: No adrenal nodule bilaterally. 1-2 mm calcified stones within the right kidney. 1-2 mm calcified stone within the left kidney. There is a 7 mm calcified stone at the left ureteropelvic junction with associated proximal mild hydronephrosis. No right ureterolithiasis. No right hydronephrosis. No contour-deforming renal mass. No ureterolithiasis or hydroureter. The urinary bladder is decompressed with trace perivesicular fat stranding.  Stomach/Bowel: Stomach is within normal limits. No evidence of bowel wall thickening or dilatation. Appendix appears normal.  Vascular/Lymphatic: No abdominal aorta or iliac aneurysm. Mild atherosclerotic plaque. Prominent but nonenlarged abdomen lymph nodes. No abdominal, pelvic, or inguinal lymphadenopathy.  Reproductive: The  prostate is not definitely identified.  Other: Trace free simple pelvic fluid. No intraperitoneal free gas. No organized fluid collection.  Musculoskeletal:  No abdominal wall hernia or abnormality.  No suspicious lytic or blastic osseous lesions. No acute displaced fracture.  IMPRESSION: 1. Obstructive 7 mm left ureteropelvic junction stone. Correlate with urinalysis for superimposed infection. 2. Nonobstructive bilateral 1-2 mm nephrolithiasis.   Electronically Signed By: Iven Finn M.D. On: 02/28/2021 07:03   Assessment & Plan:    1. Nephrolithiasis -RTC 1 month with renal US - Abdomen 1 view (KUB)   No follow-ups on file.  Nicolette Bang, MD  [facility] [place]

## 2021-04-18 ENCOUNTER — Ambulatory Visit: Payer: BC Managed Care – PPO | Admitting: Gastroenterology

## 2021-04-24 ENCOUNTER — Other Ambulatory Visit (HOSPITAL_COMMUNITY): Payer: BC Managed Care – PPO

## 2021-05-17 ENCOUNTER — Ambulatory Visit (HOSPITAL_COMMUNITY): Payer: BC Managed Care – PPO

## 2021-05-24 ENCOUNTER — Ambulatory Visit: Payer: BC Managed Care – PPO | Admitting: Urology

## 2021-08-01 ENCOUNTER — Encounter: Payer: Self-pay | Admitting: Internal Medicine

## 2021-08-01 ENCOUNTER — Ambulatory Visit: Payer: BC Managed Care – PPO | Admitting: Gastroenterology

## 2021-09-01 IMAGING — DX DG ABDOMEN 1V
2 series · 2 of 2 positions shown · non-contrast
Comparison: March 01, 2021 abdominal radiograph and CT abdomen and
pelvis February 28, 2021

CLINICAL DATA: Nephrolithiasis

EXAM:
ABDOMEN - 1 VIEW

[abdomen kub (1 of 2)]
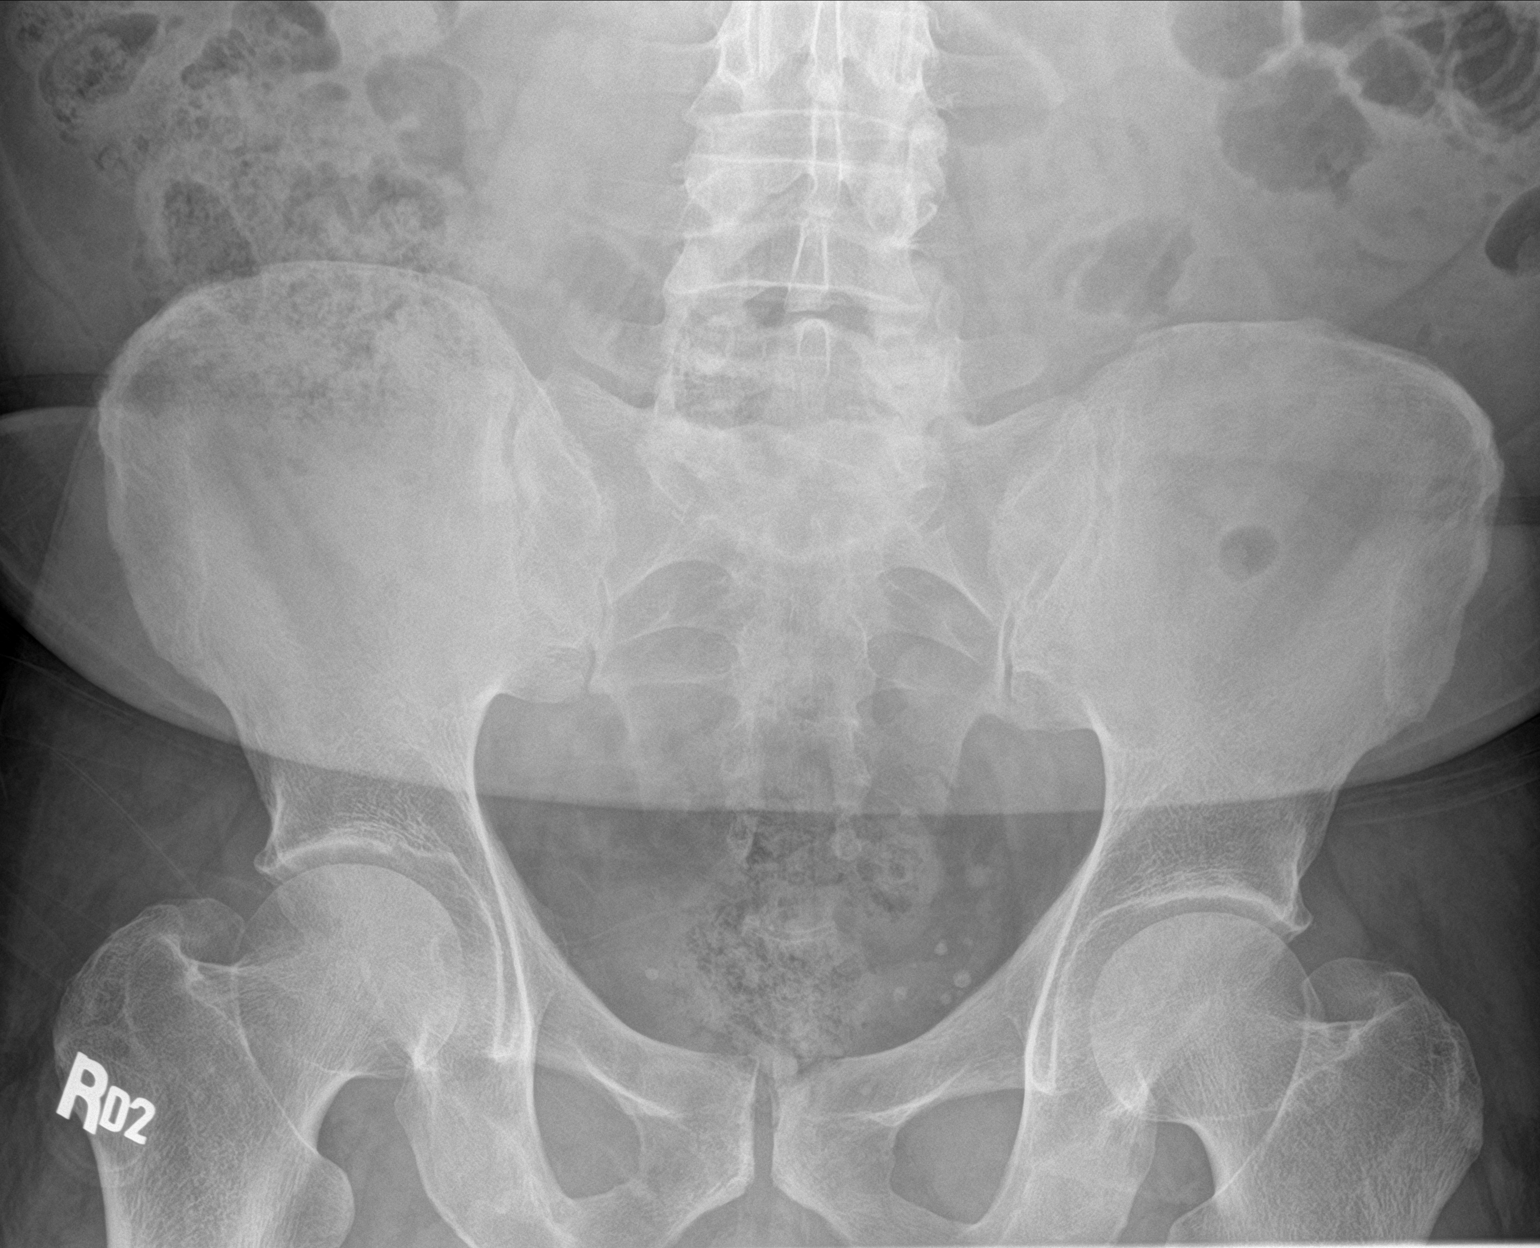

[abdomen kub (2 of 2)]
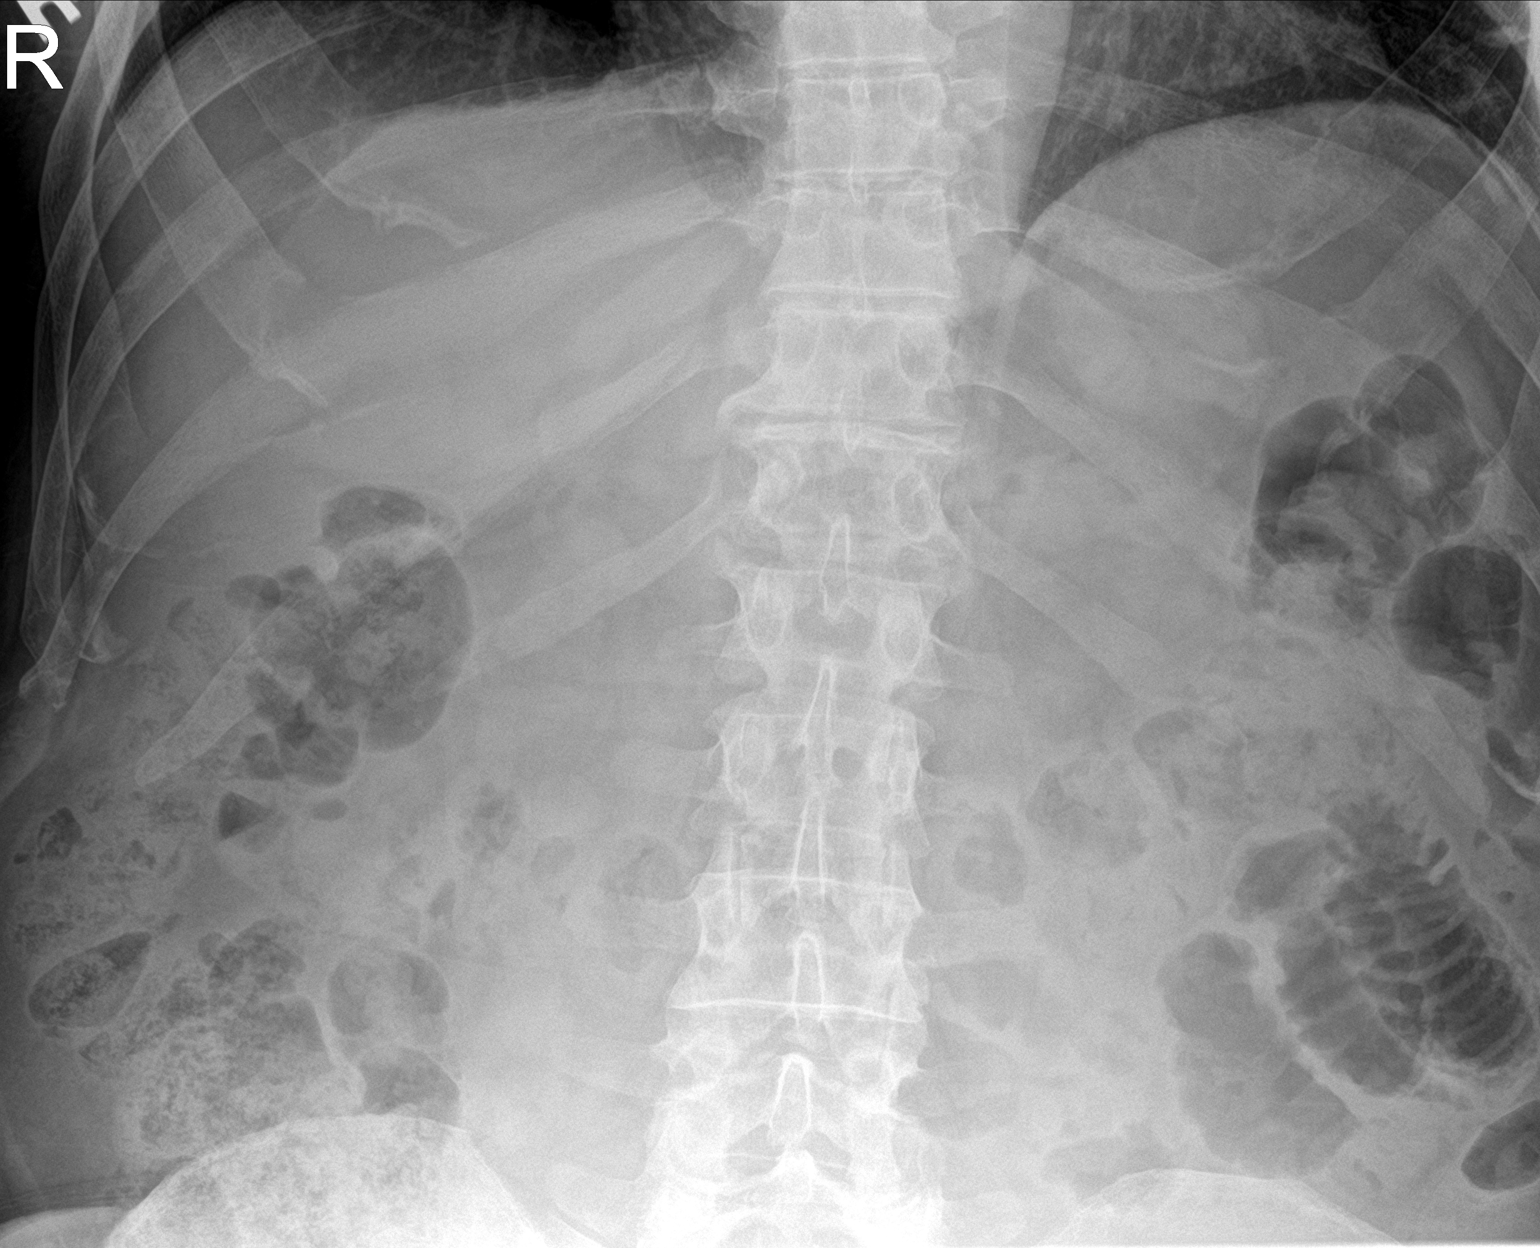

[2 of 2 positions shown; findings below may reference images not displayed]

FINDINGS: Previous calculus on the left not evident currently. Apparent
phleboliths present in the pelvis, unchanged. There is moderate
stool in the colon. There is no bowel dilatation or air-fluid level
to suggest bowel obstruction. No free air on supine examination.
Visualized lung bases clear.
IMPRESSION: Previous calculus on the left not appreciable currently. Apparent
phleboliths in the pelvis. No new calcifications evident. No bowel
obstruction or free air evident on supine examination.

## 2021-09-05 DIAGNOSIS — R609 Edema, unspecified: Secondary | ICD-10-CM | POA: Diagnosis not present

## 2021-09-05 DIAGNOSIS — I10 Essential (primary) hypertension: Secondary | ICD-10-CM | POA: Diagnosis not present

## 2021-09-19 ENCOUNTER — Other Ambulatory Visit: Payer: Self-pay

## 2021-09-19 ENCOUNTER — Encounter (HOSPITAL_COMMUNITY): Payer: Self-pay | Admitting: *Deleted

## 2021-09-19 ENCOUNTER — Emergency Department (HOSPITAL_COMMUNITY): Payer: BC Managed Care – PPO

## 2021-09-19 ENCOUNTER — Observation Stay (HOSPITAL_COMMUNITY)
Admission: EM | Admit: 2021-09-19 | Discharge: 2021-09-20 | Disposition: A | Discharge status: Home / Self Care | Payer: BC Managed Care – PPO | Attending: Cardiology | Admitting: Cardiology

## 2021-09-19 DIAGNOSIS — R079 Chest pain, unspecified: Secondary | ICD-10-CM | POA: Diagnosis not present

## 2021-09-19 DIAGNOSIS — Z20822 Contact with and (suspected) exposure to covid-19: Secondary | ICD-10-CM | POA: Diagnosis not present

## 2021-09-19 DIAGNOSIS — I208 Other forms of angina pectoris: Secondary | ICD-10-CM | POA: Diagnosis present

## 2021-09-19 DIAGNOSIS — I25119 Atherosclerotic heart disease of native coronary artery with unspecified angina pectoris: Secondary | ICD-10-CM | POA: Diagnosis not present

## 2021-09-19 DIAGNOSIS — I10 Essential (primary) hypertension: Secondary | ICD-10-CM | POA: Insufficient documentation

## 2021-09-19 DIAGNOSIS — J45909 Unspecified asthma, uncomplicated: Secondary | ICD-10-CM | POA: Insufficient documentation

## 2021-09-19 DIAGNOSIS — E785 Hyperlipidemia, unspecified: Secondary | ICD-10-CM | POA: Diagnosis present

## 2021-09-19 DIAGNOSIS — Z8546 Personal history of malignant neoplasm of prostate: Secondary | ICD-10-CM | POA: Diagnosis not present

## 2021-09-19 DIAGNOSIS — I2089 Other forms of angina pectoris: Secondary | ICD-10-CM | POA: Diagnosis present

## 2021-09-19 DIAGNOSIS — Z79899 Other long term (current) drug therapy: Secondary | ICD-10-CM | POA: Insufficient documentation

## 2021-09-19 DIAGNOSIS — I209 Angina pectoris, unspecified: Secondary | ICD-10-CM | POA: Diagnosis not present

## 2021-09-19 DIAGNOSIS — R0602 Shortness of breath: Secondary | ICD-10-CM | POA: Diagnosis not present

## 2021-09-19 DIAGNOSIS — I200 Unstable angina: Secondary | ICD-10-CM | POA: Diagnosis not present

## 2021-09-19 DIAGNOSIS — Z87891 Personal history of nicotine dependence: Secondary | ICD-10-CM | POA: Diagnosis not present

## 2021-09-19 DIAGNOSIS — R0789 Other chest pain: Secondary | ICD-10-CM | POA: Diagnosis not present

## 2021-09-19 HISTORY — DX: Unspecified asthma, uncomplicated: J45.909

## 2021-09-19 LAB — DIFFERENTIAL
Abs Immature Granulocytes: 0.02 10*3/uL (ref 0.00–0.07)
Basophils Absolute: 0.1 10*3/uL (ref 0.0–0.1)
Basophils Relative: 1 %
Eosinophils Absolute: 0.3 10*3/uL (ref 0.0–0.5)
Eosinophils Relative: 5 %
Immature Granulocytes: 0 %
Lymphocytes Relative: 31 %
Lymphs Abs: 1.8 10*3/uL (ref 0.7–4.0)
Monocytes Absolute: 0.8 10*3/uL (ref 0.1–1.0)
Monocytes Relative: 13 %
Neutro Abs: 2.9 10*3/uL (ref 1.7–7.7)
Neutrophils Relative %: 50 %

## 2021-09-19 LAB — COMPREHENSIVE METABOLIC PANEL
ALT: 16 U/L (ref 0–44)
AST: 18 U/L (ref 15–41)
Albumin: 4.4 g/dL (ref 3.5–5.0)
Alkaline Phosphatase: 52 U/L (ref 38–126)
Anion gap: 6 (ref 5–15)
BUN: 17 mg/dL (ref 8–23)
CO2: 27 mmol/L (ref 22–32)
Calcium: 9 mg/dL (ref 8.9–10.3)
Chloride: 104 mmol/L (ref 98–111)
Creatinine, Ser: 1.19 mg/dL (ref 0.61–1.24)
GFR, Estimated: >60 mL/min (ref >60)
Glucose, Bld: 100 mg/dL — ABNORMAL HIGH (ref 70–99)
Potassium: 3.8 mmol/L (ref 3.5–5.1)
Sodium: 137 mmol/L (ref 135–145)
Total Bilirubin: 0.8 mg/dL (ref 0.3–1.2)
Total Protein: 7.1 g/dL (ref 6.5–8.1)

## 2021-09-19 LAB — HIV ANTIBODY (ROUTINE TESTING W REFLEX): HIV Screen 4th Generation wRfx: NONREACTIVE

## 2021-09-19 LAB — D-DIMER, QUANTITATIVE: D-Dimer, Quant: 0.35 ug/mL-FEU (ref 0.00–0.50)

## 2021-09-19 LAB — CBC
HCT: 47.2 % (ref 39.0–52.0)
Hemoglobin: 15.6 g/dL (ref 13.0–17.0)
MCH: 29.6 pg (ref 26.0–34.0)
MCHC: 33.1 g/dL (ref 30.0–36.0)
MCV: 89.6 fL (ref 80.0–100.0)
Platelets: 193 10*3/uL (ref 150–400)
RBC: 5.27 MIL/uL (ref 4.22–5.81)
RDW: 12.8 % (ref 11.5–15.5)
WBC: 5.9 10*3/uL (ref 4.0–10.5)
nRBC: 0 % (ref 0.0–0.2)

## 2021-09-19 LAB — TROPONIN I (HIGH SENSITIVITY)
Troponin I (High Sensitivity): 5 ng/L (ref <18)
Troponin I (High Sensitivity): 5 ng/L (ref <18)
Troponin I (High Sensitivity): 6 ng/L (ref <18)

## 2021-09-19 LAB — RESP PANEL BY RT-PCR (FLU A&B, COVID) ARPGX2
Influenza A by PCR: NEGATIVE
Influenza B by PCR: NEGATIVE
SARS Coronavirus 2 by RT PCR: NEGATIVE

## 2021-09-19 MED ORDER — ASPIRIN 325 MG PO TABS
325.0000 mg | ORAL_TABLET | ORAL | Status: AC
  Administered 2021-09-19: 325 mg via ORAL
  Filled 2021-09-19: qty 1

## 2021-09-19 MED ORDER — POLYETHYLENE GLYCOL 3350 17 G PO PACK: 17.0000 g | PACK | Freq: Every day | ORAL | Status: DC | PRN

## 2021-09-19 MED ORDER — MELATONIN 3 MG PO TABS: 3.0000 mg | ORAL_TABLET | Freq: Every evening | ORAL | Status: DC | PRN

## 2021-09-19 MED ORDER — MORPHINE SULFATE (PF) 2 MG/ML IV SOLN: 2.0000 mg | INTRAVENOUS | Status: DC | PRN

## 2021-09-19 MED ORDER — ONDANSETRON HCL 4 MG/2ML IJ SOLN: 4.0000 mg | Freq: Four times a day (QID) | INTRAMUSCULAR | Status: DC | PRN

## 2021-09-19 MED ORDER — ATORVASTATIN CALCIUM 40 MG PO TABS
80.0000 mg | ORAL_TABLET | Freq: Every day | ORAL | Status: DC
  Administered 2021-09-20: 80 mg via ORAL
  Filled 2021-09-19: qty 2

## 2021-09-19 MED ORDER — ASPIRIN EC 81 MG PO TBEC: 81.0000 mg | DELAYED_RELEASE_TABLET | Freq: Every day | ORAL | Status: DC

## 2021-09-19 MED ORDER — ENOXAPARIN SODIUM 40 MG/0.4ML IJ SOSY
40.0000 mg | PREFILLED_SYRINGE | INTRAMUSCULAR | Status: DC
  Administered 2021-09-19: 40 mg via SUBCUTANEOUS
  Filled 2021-09-19: qty 0.4

## 2021-09-19 MED ORDER — LACTATED RINGERS IV SOLN: INTRAVENOUS | Status: DC

## 2021-09-19 MED ORDER — NITROGLYCERIN 0.4 MG SL SUBL: 0.4000 mg | SUBLINGUAL_TABLET | SUBLINGUAL | Status: DC | PRN

## 2021-09-19 MED ORDER — ACETAMINOPHEN 325 MG PO TABS: 650.0000 mg | ORAL_TABLET | Freq: Four times a day (QID) | ORAL | Status: DC | PRN

## 2021-09-19 MED ORDER — OXYCODONE HCL 5 MG PO TABS
5.0000 mg | ORAL_TABLET | ORAL | Status: DC | PRN
Start: 2021-09-19 — End: 2021-09-20

## 2021-09-19 MED ORDER — IRBESARTAN 75 MG PO TABS
37.5000 mg | ORAL_TABLET | Freq: Every day | ORAL | Status: DC
  Administered 2021-09-20: 37.5 mg via ORAL
  Filled 2021-09-19: qty 1

## 2021-09-19 NOTE — ED Notes (Signed)
Per hospitalist and cardiology ok for pt to eat, to be NPO after midnight for possible cardiac cath.  Provided snack and fluids

## 2021-09-19 NOTE — Consult Note (Addendum)
Cardiology Consultation:   Patient ID: [name] [name]; [id number]; [birth year]   Admit date: 09/19/2021 Date of Consult: 09/19/2021  Primary Care Provider: Asencion Noble, MD Primary Cardiologist: New to [facility] Primary Electrophysiologist: None   Patient Profile:   [name] [name] is a 60 y.o. male with a history of hypertension, hyperlipidemia, and prostate cancer who is being seen today for the evaluation of recent recurring chest pain and breathlessness at the request of Dr. Nevada Crane.  History of Present Illness:   [name] [name] presents to the [facility] ER reporting recent onset recurring chest discomfort and shortness of breath out of proportion to activity.  He reports a longstanding history of hypertension managed by Dr. Willey Blade, most recently switched from Norvasc due to leg swelling to olmesartan with reportedly good blood pressure: until this weekend.  He states that he woke up on Saturday with a headache, later on that day started experiencing a dull discomfort in his chest and shortness of breath with even simple activities, very symptomatic with walking up 12 steps.  Symptoms waxed and waned, recurred later in the weekend when he went shopping at [facility] and had to stop while walking through the store.  He has continued to have similar symptoms and came to the ER today, also concerned that his blood pressure had been suddenly higher and not coming down despite taking regular medications.  He has no documented history of ischemic heart disease.  Work-up in the ER includes normal high-sensitivity troponin I levels and no acute ST segment changes by ECG.  Chest x-ray reports no acute process.  He is being admitted to the hospitalist service for further evaluation.  Past Medical History:  Diagnosis Date   Childhood asthma    Diverticulitis    History of kidney stones    Hypercholesteremia    Hypertension    Prostate cancer [facility])     Past Surgical History:  Procedure Laterality Date    EXTRACORPOREAL SHOCK WAVE LITHOTRIPSY Left 03/14/2021   Procedure: EXTRACORPOREAL SHOCK WAVE LITHOTRIPSY (ESWL);  Surgeon: Cleon Gustin, MD;  Location: [facility];  Service: Urology;  Laterality: Left;   NECK SURGERY     ROBOT ASSISTED LAPAROSCOPIC RADICAL PROSTATECTOMY  2009   SPINE SURGERY       Inpatient Medications: Scheduled Meds:  [START ON 09/20/2021] aspirin EC  81 mg Oral Daily   aspirin  325 mg Oral STAT   enoxaparin (LOVENOX) injection  40 mg Subcutaneous Q24H   irbesartan  37.5 mg Oral Daily    PRN Meds: acetaminophen, melatonin, morphine injection, nitroGLYCERIN, ondansetron (ZOFRAN) IV, oxyCODONE, polyethylene glycol  Allergies:    Allergies  Allergen Reactions   Celecoxib     Pt unsure    Penicillins     Childhood    Sulfonamide Derivatives     REACTION: unknown reaction    Social History:   Social History   Tobacco Use   Smoking status: Former    Packs/day: 2.00    Years: 40.00    Pack years: 80.00    Types: Cigarettes    Quit date: 12/03/1979    Years since quitting: 41.8   Smokeless tobacco: Never  Substance Use Topics   Alcohol use: Yes    Alcohol/week: 2.0 standard drinks    Types: 2 Cans of beer per week    Comment: couple drinks per week     Family History:   The patient's family history includes Hypertension in his father.  ROS:  Please see the  history of present illness.  All other ROS reviewed and negative.     Physical Exam/Data:   Vitals:   09/19/21 1252 09/19/21 1300 09/19/21 1330 09/19/21 1400  BP: (!) 155/89 (!) 143/91 (!) 155/89   Pulse: (!) 54 (!) 56 (!) 56 (!) 56  Resp: (!) 22 13 15 13   Temp:      TempSrc:      SpO2: 98% 100% 98% 99%  Weight:      Height:       No intake or output data in the 24 hours ending 09/19/21 1505 Filed Weights   09/19/21 1000  Weight: 113.4 kg   Body mass index is 33.91 kg/m.   Gen: Patient appears comfortable at rest. HEENT: Conjunctiva and lids normal, oropharynx clear with  moist mucosa. Neck: Supple, no elevated JVP or carotid bruits, no thyromegaly. Lungs: Clear to auscultation, nonlabored breathing at rest. Cardiac: Regular rate and rhythm, no S3 or significant systolic murmur, no pericardial rub. Abdomen: Soft, nontender, no hepatomegaly, bowel sounds present. Extremities: No pitting edema, distal pulses 2+. Skin: Warm and dry. Musculoskeletal: No kyphosis. Neuropsychiatric: Alert and oriented x3, affect grossly appropriate.  EKG:  An ECG dated 09/19/2021 was personally reviewed today and demonstrated:  Sinus rhythm.  Telemetry:  I personally reviewed telemetry which shows sinus rhythm.  Relevant CV Studies:  No prior cardiac testing.  Laboratory Data:  Chemistry Recent Labs  Lab 09/19/21 1025  NA 137  K 3.8  CL 104  CO2 27  GLUCOSE 100*  BUN 17  CREATININE 1.19  CALCIUM 9.0  GFRNONAA >60  ANIONGAP 6    Recent Labs  Lab 09/19/21 1025  PROT 7.1  ALBUMIN 4.4  AST 18  ALT 16  ALKPHOS 52  BILITOT 0.8   Hematology Recent Labs  Lab 09/19/21 1025  WBC 5.9  RBC 5.27  HGB 15.6  HCT 47.2  MCV 89.6  MCH 29.6  MCHC 33.1  RDW 12.8  PLT 193   Cardiac Enzymes Recent Labs  Lab 09/19/21 1025 09/19/21 1221  TROPONINIHS 5 5    DDimer Recent Labs  Lab 09/19/21 1025  DDIMER 0.35    Radiology/Studies:  DG Chest 2 View  Result Date: 09/19/2021 CLINICAL DATA:  Chest pain and shortness of breath today. EXAM: CHEST - 2 VIEW COMPARISON:  01/08/2008. FINDINGS: Cardiac silhouette is normal in size. Normal mediastinal and hilar contours. Mild linear opacities in the left lateral lung base consistent with scarring. Lungs otherwise clear. No pleural effusion or pneumothorax. Skeletal structures intact. Stable changes from a prior anterior cervical spine fusion. IMPRESSION: No active cardiopulmonary disease. Electronically Signed   By: Lajean Manes M.D.   On: 09/19/2021 11:03    Assessment and Plan:   1.  Accelerating angina and  shortness of breath with symptom onset over the weekend.  High-sensitivity troponin I levels are normal at this point and ECG shows no acute ST segment changes.  He has also had concurrent difficulty with uncontrolled blood pressure despite regular medical therapy.  No prior history of documented ischemic heart disease.  2.  Essential hypertension, on Avapro as an outpatient.  3.  Uncertain lipid status with reported history of hyperlipidemia. Not on statin therapy.  Patient is being admitted to the hospitalist service.  Discussed situation with patient and wife.  Given description of symptoms would suggest diagnostic cardiac catheterization rather than noninvasive ischemic cardiac testing to most definitively clarify coronary anatomy and exclude any need for revascularization.  It is reassuring  that his cardiac enzymes and ECG are normal at this point, but this does not exclude an unstable coronary plaque as cause of his recent exertional limitations and uncontrolled blood pressure.  Echocardiogram has been ordered as well.  Continue Avapro, hold off on beta-blocker in light of heart rate in the 50s to 60s, begin empiric statin therapy, and continue aspirin 81 mg daily.  He has been placed on Lovenox, would continue to cycle cardiac enzymes.  Would start IV nitroglycerin if he has any recurring symptoms at rest.  We will work on arranging transfer to [facility] tomorrow for diagnostic cardiac catheterization.  I discussed the risks and benefits and he is in agreement to proceed.  Signed, Rozann Lesches, MD  09/19/2021 3:05 PM

## 2021-09-19 NOTE — ED Notes (Signed)
Pt requesting something to help him rest during the night. Hospitalist aware.

## 2021-09-19 NOTE — H&P (Addendum)
History and Physical  CHAITANYA AMEDEE UMP:[id number] DOB: May 24, 1961 DOA: 09/19/2021  Referring physician: Lia Hopping, PA-EDP. PCP: Asencion Noble, MD  Outpatient Specialists: None. Patient coming from: Home.  Chief Complaint: Chest pain with exertion.  HPI: [name] [name] is a 60 y.o. male with medical history significant for asthma, hypertension, hyperlipidemia, nephrolithiasis, neck pain, status post neck surgery, who presented to [facility], ED with complaints of exertional chest pain, centrally located, dull, 8-9 out of 10 in severity, nonradiating and improved with rest.  Associated with dyspnea, nausea with no vomiting.  No palpitations.  The pain has been intermittent.  No prior use of aspirin at home.  First onset over this past weekend while he was walking at [place].  Improved after he rested, lasting a few minutes and resolving spontaneously.  He went to work this morning.  His pain restarted.  With the same pattern.  No heightened stress at work.  No recent lengthy trips.  He presented to the ED for further evaluation.  Endorses recent changes in his blood pressure medication.  He was switched from amlodipine to olmesartan due to bilateral lower extremity edema which was thought to be secondary to Norvasc.  While in the ED, blood pressure is elevated.  First 2 sets of troponin negative x2.  At the time of this visit his chest pain was still present but improved in 3 out of 10 in intensity from 8-9 out of 10.  Cardiology, Dr. Domenic Polite, has been consulted by EDP.  TR[facility], hospitalist team, was asked to admit.  ED Course: Temperature 98.  BP 147/94, pulse 56, respiration rate 13, with saturation 99% on room air.  Lab studies, CBC with differential, and CMP, essentially unremarkable.  Review of Systems: Review of systems as noted in the HPI. All other systems reviewed and are negative.   Past Medical History:  Diagnosis Date   Childhood asthma    Diverticulitis    History of kidney stones     Hypercholesteremia    Hypertension    Prostate cancer [facility]    Past Surgical History:  Procedure Laterality Date   EXTRACORPOREAL SHOCK WAVE LITHOTRIPSY Left 03/14/2021   Procedure: EXTRACORPOREAL SHOCK WAVE LITHOTRIPSY (ESWL);  Surgeon: Cleon Gustin, MD;  Location: [facility];  Service: Urology;  Laterality: Left;   NECK SURGERY     ROBOT ASSISTED LAPAROSCOPIC RADICAL PROSTATECTOMY  2009   SPINE SURGERY      Social History:  reports that he quit smoking about 41 years ago. His smoking use included cigarettes. He has a 80.00 pack-year smoking history. He has never used smokeless tobacco. He reports current alcohol use of about 2.0 standard drinks per week. He reports that he does not currently use drugs.   Allergies  Allergen Reactions   Celecoxib     Pt unsure    Penicillins     Childhood    Sulfonamide Derivatives     REACTION: unknown reaction   Family history: Mother with history of heart disease in her 26s, deceased. Father with history of heart disease in his 56s. Paternal grandfather, heart disease in his 53s or 65s.  Prior to Admission medications   Medication Sig Start Date End Date Taking? Authorizing Provider  fluticasone (FLONASE) 50 MCG/ACT nasal spray Place 1-2 sprays into both nostrils daily.   Yes [provider]  olmesartan (BENICAR) 20 MG tablet Take 20 mg by mouth daily. 09/05/21  Yes [provider]  sildenafil (REVATIO) 20 MG tablet 1-5 po qday prn  Patient not taking: Reported on 09/19/2021 12/02/20   Irine Seal, MD  tamsulosin (FLOMAX) 0.4 MG CAPS capsule Take 1 capsule (0.4 mg total) by mouth daily after supper. Patient not taking: Reported on 09/19/2021 04/12/21   Cleon Gustin, MD    Physical Exam: BP (!) 155/89   Pulse (!) 56   Temp 98 F (36.7 C) (Oral)   Resp 13   Ht 6' (1.829 m)   Wt 113.4 kg   SpO2 99%   BMI 33.91 kg/m   General: 60 y.o. year-old male well developed well nourished in no acute distress.  Alert and  oriented x3. Cardiovascular: Regular rate and rhythm with no rubs or gallops.  No thyromegaly or JVD noted.  No lower extremity edema. 2/4 pulses in all 4 extremities.  Mild substernal tenderness with moderate palpation. Respiratory: Clear to auscultation with no wheezes or rales. Good inspiratory effort. Abdomen: Soft nontender nondistended with normal bowel sounds x4 quadrants. Muskuloskeletal: No cyanosis, clubbing or edema noted bilaterally Neuro: CN II-XII intact, strength, sensation, reflexes Skin: No ulcerative lesions noted or rashes Psychiatry: Judgement and insight appear normal. Mood is appropriate for condition and setting          Labs on Admission:  Basic Metabolic Panel: Recent Labs  Lab 09/19/21 1025  NA 137  K 3.8  CL 104  CO2 27  GLUCOSE 100*  BUN 17  CREATININE 1.19  CALCIUM 9.0   Liver Function Tests: Recent Labs  Lab 09/19/21 1025  AST 18  ALT 16  ALKPHOS 52  BILITOT 0.8  PROT 7.1  ALBUMIN 4.4   No results for input(s): LIPASE, AMYLASE in the last 168 hours. No results for input(s): AMMONIA in the last 168 hours. CBC: Recent Labs  Lab 09/19/21 1025  WBC 5.9  NEUTROABS 2.9  HGB 15.6  HCT 47.2  MCV 89.6  PLT 193   Cardiac Enzymes: No results for input(s): CKTOTAL, CKMB, CKMBINDEX, TROPONINI in the last 168 hours.  BNP (last 3 results) No results for input(s): BNP in the last 8760 hours.  ProBNP (last 3 results) No results for input(s): PROBNP in the last 8760 hours.  CBG: No results for input(s): GLUCAP in the last 168 hours.  Radiological Exams on Admission: DG Chest 2 View  Result Date: 09/19/2021 CLINICAL DATA:  Chest pain and shortness of breath today. EXAM: CHEST - 2 VIEW COMPARISON:  01/08/2008. FINDINGS: Cardiac silhouette is normal in size. Normal mediastinal and hilar contours. Mild linear opacities in the left lateral lung base consistent with scarring. Lungs otherwise clear. No pleural effusion or pneumothorax. Skeletal  structures intact. Stable changes from a prior anterior cervical spine fusion. IMPRESSION: No active cardiopulmonary disease. Electronically Signed   By: Lajean Manes M.D.   On: 09/19/2021 11:03    EKG: I independently viewed the EKG done and my findings are as followed: Sinus rhythm rate of 61.  Nonspecific ST-T changes.  QTc 4 6.  Assessment/Plan Present on Admission:  Chest pain  Active Problems:   Chest pain  Chest pain, rule out ACS Exertional pain, improved at rest.  Associated with dyspnea and nausea without vomiting. First 2 sets of troponin negative, twelve-lead EKG no evidence of acute ischemia. Full dose aspirin, 325 mg x 1, ordered to be administered. Aspirin 81 mg daily added, starting tomorrow. Due to persistent chest pain, will repeat troponin Fasting lipid panel Obtain 2D echo Monitor on telemetry Cardiology consulted by EDP.  Essential hypertension, not at goal BP, elevated  Resume home olmesartan Monitor vital signs.  Obesity BMI 33 Recommend weight loss outpatient with regular physical activity and healthy dieting.   DVT prophylaxis: Subcu Lovenox daily  Code Status: Full code  Family Communication: Wife at the bedside  Disposition Plan: Admitted to telemetry unit  Consults called: Cardiology consulted by EDP  Admission status: Observation status.   Status is: Observation    Dispo:  Patient From:  Home  Planned Disposition:  Home  Medically stable for discharge:  No       Kayleen Memos MD [facility] Pager [phone]  If 7PM-7AM, please contact night-coverage [email or web address] Password [facility]  09/19/2021, 2:37 PM

## 2021-09-19 NOTE — ED Provider Notes (Signed)
[facility] EMERGENCY DEPARTMENT Provider Note   CSN: [id number] Arrival date & time: 09/19/21  3382     History Chief Complaint  Patient presents with   Chest Pain    [name] [name] is a 60 y.o. male.   Chest Pain Associated symptoms: headache, nausea and shortness of breath   Associated symptoms: no abdominal pain, no back pain, no cough, no fatigue, no fever, no palpitations and no vomiting    Patient presents with CP. Reports switched BP medicine from amlodipine to a thiazide one week ago. BP has been high this week. 3 days ago started having substernal chest pain. Feels like pressure, radiates up his neck bilaterally. Assoc with SOB and nausea, no vomiting. Worsened when walking around at [facility]. Improved with rest. Reports assoc tenderness posterior to left knee. Also reports HA that has been intermittent, gradually worsening.   H/O of prostate cancer a decade ago, no current chemo or radiation. No h/o PE, MI, or stroke. H/O of 1st degree Fhx with CAD. Patient has HTN, and hyperlipemia, former smoker.   Past Medical History:  Diagnosis Date   Asthma    as child   Diverticulitis    History of kidney stones    Hypercholesteremia    Hypertension    Prostate cancer [facility])     Patient Active Problem List   Diagnosis Date Noted   Nephrolithiasis 03/01/2021   PROCTALGIA FUGAX 04/13/2009   HEMATOCHEZIA 04/11/2009   ALLERGY 04/11/2009   CARCINOMA, PROSTATE 12/25/2007   DYSLIPIDEMIA 12/25/2007   ASTHMA 12/25/2007    Past Surgical History:  Procedure Laterality Date   EXTRACORPOREAL SHOCK WAVE LITHOTRIPSY Left 03/14/2021   Procedure: EXTRACORPOREAL SHOCK WAVE LITHOTRIPSY (ESWL);  Surgeon: Cleon Gustin, MD;  Location: [facility];  Service: Urology;  Laterality: Left;   NECK SURGERY     ROBOT ASSISTED LAPAROSCOPIC RADICAL PROSTATECTOMY  2009   SPINE SURGERY         No family history on file.  Social History   Tobacco Use   Smoking status: Former     Packs/day: 2.00    Years: 40.00    Pack years: 80.00    Types: Cigarettes    Quit date: 12/03/1979    Years since quitting: 41.8   Smokeless tobacco: Never  Vaping Use   Vaping Use: Never used  Substance Use Topics   Alcohol use: Yes    Alcohol/week: 2.0 standard drinks    Types: 2 Cans of beer per week    Comment: couple drinks per week   Drug use: Not Currently    Home Medications Prior to Admission medications   Medication Sig Start Date End Date Taking? Authorizing Provider  amLODipine (NORVASC) 5 MG tablet Take 5 mg by mouth daily. 09/16/20   [provider]  fluticasone (FLONASE) 50 MCG/ACT nasal spray Place 1-2 sprays into both nostrils daily.    [provider]  sildenafil (REVATIO) 20 MG tablet 1-5 po qday prn 12/02/20   Irine Seal, MD  tamsulosin (FLOMAX) 0.4 MG CAPS capsule Take 1 capsule (0.4 mg total) by mouth daily after supper. 04/12/21   McKenzie, Candee Furbish, MD    Allergies    Celecoxib, Penicillins, and Sulfonamide derivatives  Review of Systems   Review of Systems  Constitutional:  Negative for chills, fatigue and fever.  HENT:  Negative for ear pain and sore throat.   Eyes:  Negative for pain and visual disturbance.  Respiratory:  Positive for shortness of breath. Negative for  cough.   Cardiovascular:  Positive for chest pain and leg swelling. Negative for palpitations.  Gastrointestinal:  Positive for nausea. Negative for abdominal pain and vomiting.  Genitourinary:  Negative for dysuria and hematuria.  Musculoskeletal:  Positive for neck pain. Negative for arthralgias and back pain.  Skin:  Negative for color change and rash.  Neurological:  Positive for headaches. Negative for seizures and syncope.  All other systems reviewed and are negative.  Physical Exam Updated Vital Signs BP (!) 178/93 (BP Location: Right Arm)   Pulse 63   Temp 98 F (36.7 C) (Oral)   Resp 16   Ht 6' (1.829 m)   Wt 113.4 kg   SpO2 100%   BMI 33.91 kg/m    Physical Exam Vitals and nursing note reviewed. Exam conducted with a chaperone present.  Constitutional:      Appearance: Normal appearance. He is obese.  HENT:     Head: Normocephalic and atraumatic.  Eyes:     General: No scleral icterus.       Right eye: No discharge.        Left eye: No discharge.     Extraocular Movements: Extraocular movements intact.     Pupils: Pupils are equal, round, and reactive to light.  Cardiovascular:     Rate and Rhythm: Normal rate and regular rhythm.     Pulses: Normal pulses.     Heart sounds: Normal heart sounds. No murmur heard.   No friction rub. No gallop.     Comments: DP, PT and radial pulses 2+ bilaterally  Pulmonary:     Effort: Pulmonary effort is normal. No respiratory distress.     Breath sounds: Normal breath sounds.     Comments: Speaking in complete sentences  Abdominal:     General: Abdomen is flat. Bowel sounds are normal. There is no distension.     Palpations: Abdomen is soft.     Tenderness: There is no abdominal tenderness.  Musculoskeletal:     Comments: Posterior L knee TTP. Negative homans. No edema, LE roughly symmetrical.   Skin:    General: Skin is warm and dry.     Capillary Refill: Capillary refill takes less than 2 seconds.     Coloration: Skin is not jaundiced.  Neurological:     Mental Status: He is alert. Mental status is at baseline.     Coordination: Coordination normal.    ED Results / Procedures / Treatments   Labs (all labs ordered are listed, but only abnormal results are displayed) Labs Reviewed  CBC  DIFFERENTIAL  D-DIMER, QUANTITATIVE  TROPONIN I (HIGH SENSITIVITY)    EKG None  Radiology No results found.  Procedures Procedures   Medications Ordered in ED Medications - No data to display  ED Course  I have reviewed the triage vital signs and the nursing notes.  Pertinent labs & imaging results that were available during my care of the patient were reviewed by me and  considered in my medical decision making (see chart for details).    MDM Rules/Calculators/A&P                            Heart score 5. Patient has risk factors of CAD in 1st degree family member, HTN, hypercholesteremia, and former smoking h/o. He has exertional symptoms as well at chest pain at rest. New onset as of two-three days ago, concern for progressively worsening unstable angina. No prior cardiac history.  Negative troponin delta, EKG without ST changes concerning for ACS, LVH or new LBBB. However, patient has significant cardiac risk factors and a concerning story. I think he would benefit from further evaluation in hospital. Will consult cardiology.  Spoke with Cardiologist Dr. Domenic Polite. He agrees to consult on the patient and advises hospital admission. Will admit to hospitalist service.   Final Clinical Impression(s) / ED Diagnoses Final diagnoses:  None    Rx / DC Orders ED Discharge Orders     None        Sherrill Raring, PA-C 09/19/21 1410    Milton Ferguson, MD 09/23/21 1101

## 2021-09-19 NOTE — ED Triage Notes (Signed)
Pt c/o mid chest pain that radiates into neck and back of head, SOB, lightheadedness and slight nausea that started this weekend. Pt reports his BP has been elevated x 4 days. Recent change in BP medication.

## 2021-09-19 NOTE — ED Notes (Signed)
Meal tray provided, pt aware of orders to be NPO after midnight for cardiac cath tomorrow.  No active chest pain at this time.

## 2021-09-20 ENCOUNTER — Other Ambulatory Visit (HOSPITAL_COMMUNITY): Payer: BC Managed Care – PPO

## 2021-09-20 ENCOUNTER — Encounter (HOSPITAL_COMMUNITY): Payer: Self-pay | Admitting: Internal Medicine

## 2021-09-20 ENCOUNTER — Ambulatory Visit (HOSPITAL_COMMUNITY)
Admission: EM | Disposition: A | Discharge status: Home / Self Care | Payer: Self-pay | Source: Home / Self Care | Attending: Emergency Medicine

## 2021-09-20 ENCOUNTER — Other Ambulatory Visit: Payer: Self-pay

## 2021-09-20 ENCOUNTER — Ambulatory Visit (HOSPITAL_COMMUNITY)
Admission: RE | Admit: 2021-09-20 | Payer: BC Managed Care – PPO | Source: Home / Self Care | Admitting: Cardiovascular Disease

## 2021-09-20 DIAGNOSIS — I208 Other forms of angina pectoris: Secondary | ICD-10-CM | POA: Diagnosis not present

## 2021-09-20 DIAGNOSIS — Z8546 Personal history of malignant neoplasm of prostate: Secondary | ICD-10-CM | POA: Diagnosis not present

## 2021-09-20 DIAGNOSIS — I25119 Atherosclerotic heart disease of native coronary artery with unspecified angina pectoris: Secondary | ICD-10-CM | POA: Diagnosis not present

## 2021-09-20 DIAGNOSIS — I200 Unstable angina: Secondary | ICD-10-CM | POA: Diagnosis not present

## 2021-09-20 DIAGNOSIS — Z20822 Contact with and (suspected) exposure to covid-19: Secondary | ICD-10-CM | POA: Diagnosis not present

## 2021-09-20 DIAGNOSIS — Z87891 Personal history of nicotine dependence: Secondary | ICD-10-CM | POA: Diagnosis not present

## 2021-09-20 DIAGNOSIS — I10 Essential (primary) hypertension: Secondary | ICD-10-CM | POA: Diagnosis not present

## 2021-09-20 DIAGNOSIS — Z79899 Other long term (current) drug therapy: Secondary | ICD-10-CM | POA: Diagnosis not present

## 2021-09-20 DIAGNOSIS — I209 Angina pectoris, unspecified: Secondary | ICD-10-CM | POA: Diagnosis not present

## 2021-09-20 DIAGNOSIS — J45909 Unspecified asthma, uncomplicated: Secondary | ICD-10-CM | POA: Diagnosis not present

## 2021-09-20 HISTORY — PX: LEFT HEART CATH AND CORONARY ANGIOGRAPHY: CATH118249

## 2021-09-20 LAB — MAGNESIUM: Magnesium: 2.1 mg/dL (ref 1.7–2.4)

## 2021-09-20 LAB — PHOSPHORUS: Phosphorus: 3.2 mg/dL (ref 2.5–4.6)

## 2021-09-20 LAB — CBC
HCT: 45.1 % (ref 39.0–52.0)
Hemoglobin: 14.9 g/dL (ref 13.0–17.0)
MCH: 29.4 pg (ref 26.0–34.0)
MCHC: 33 g/dL (ref 30.0–36.0)
MCV: 89.1 fL (ref 80.0–100.0)
Platelets: 180 10*3/uL (ref 150–400)
RBC: 5.06 MIL/uL (ref 4.22–5.81)
RDW: 12.6 % (ref 11.5–15.5)
WBC: 6.4 10*3/uL (ref 4.0–10.5)
nRBC: 0 % (ref 0.0–0.2)

## 2021-09-20 LAB — BASIC METABOLIC PANEL
Anion gap: 6 (ref 5–15)
BUN: 17 mg/dL (ref 8–23)
CO2: 25 mmol/L (ref 22–32)
Calcium: 8.6 mg/dL — ABNORMAL LOW (ref 8.9–10.3)
Chloride: 107 mmol/L (ref 98–111)
Creatinine, Ser: 1.13 mg/dL (ref 0.61–1.24)
GFR, Estimated: >60 mL/min (ref >60)
Glucose, Bld: 102 mg/dL — ABNORMAL HIGH (ref 70–99)
Potassium: 3.7 mmol/L (ref 3.5–5.1)
Sodium: 138 mmol/L (ref 135–145)

## 2021-09-20 LAB — LIPID PANEL
Cholesterol: 188 mg/dL (ref 0–200)
HDL: 36 mg/dL — ABNORMAL LOW (ref >40)
LDL Cholesterol: 124 mg/dL — ABNORMAL HIGH (ref 0–99)
Total CHOL/HDL Ratio: 5.2 RATIO
Triglycerides: 138 mg/dL (ref <150)
VLDL: 28 mg/dL (ref 0–40)

## 2021-09-20 SURGERY — LEFT HEART CATH AND CORONARY ANGIOGRAPHY
Anesthesia: LOCAL

## 2021-09-20 MED ORDER — SODIUM CHLORIDE 0.9% FLUSH: 3.0000 mL | Freq: Two times a day (BID) | INTRAVENOUS | Status: DC

## 2021-09-20 MED ORDER — ATORVASTATIN CALCIUM 20 MG PO TABS: 20.0000 mg | ORAL_TABLET | Freq: Every day | ORAL | 11 refills | Status: DC

## 2021-09-20 MED ORDER — FENTANYL CITRATE (PF) 100 MCG/2ML IJ SOLN
INTRAMUSCULAR | Status: DC | PRN
  Administered 2021-09-20: 25 ug via INTRAVENOUS

## 2021-09-20 MED ORDER — IOHEXOL 350 MG/ML SOLN
INTRAVENOUS | Status: DC | PRN
  Administered 2021-09-20: 55 mL via INTRA_ARTERIAL

## 2021-09-20 MED ORDER — SODIUM CHLORIDE 0.9 % WEIGHT BASED INFUSION
1.0000 mL/kg/h | INTRAVENOUS | Status: DC
  Administered 2021-09-20 (×2): 1 mL/kg/h via INTRAVENOUS

## 2021-09-20 MED ORDER — LIDOCAINE HCL (PF) 1 % IJ SOLN
INTRAMUSCULAR | Status: DC | PRN
  Administered 2021-09-20: 2 mL via INTRADERMAL

## 2021-09-20 MED ORDER — HEPARIN SODIUM (PORCINE) 1000 UNIT/ML IJ SOLN
INTRAMUSCULAR | Status: DC | PRN
  Administered 2021-09-20: 6000 [IU] via INTRAVENOUS

## 2021-09-20 MED ORDER — FENTANYL CITRATE (PF) 100 MCG/2ML IJ SOLN
INTRAMUSCULAR | Status: AC
  Filled 2021-09-20: qty 2

## 2021-09-20 MED ORDER — LIDOCAINE HCL (PF) 1 % IJ SOLN
INTRAMUSCULAR | Status: AC
  Filled 2021-09-20: qty 30

## 2021-09-20 MED ORDER — VERAPAMIL HCL 2.5 MG/ML IV SOLN
INTRAVENOUS | Status: AC
  Filled 2021-09-20: qty 2

## 2021-09-20 MED ORDER — ASPIRIN 81 MG PO CHEW: 81.0000 mg | CHEWABLE_TABLET | ORAL | Status: DC

## 2021-09-20 MED ORDER — SODIUM CHLORIDE 0.9 % WEIGHT BASED INFUSION
3.0000 mL/kg/h | INTRAVENOUS | Status: DC
  Administered 2021-09-20: 3 mL/kg/h via INTRAVENOUS

## 2021-09-20 MED ORDER — SODIUM CHLORIDE 0.9 % WEIGHT BASED INFUSION
1.0000 mL/kg/h | INTRAVENOUS | Status: DC
Start: 2021-09-21 — End: 2021-09-20

## 2021-09-20 MED ORDER — SODIUM CHLORIDE 0.9 % WEIGHT BASED INFUSION: 1.0000 mL/kg/h | INTRAVENOUS | Status: DC

## 2021-09-20 MED ORDER — SODIUM CHLORIDE 0.9 % IV SOLN: 250.0000 mL | INTRAVENOUS | Status: DC | PRN

## 2021-09-20 MED ORDER — LABETALOL HCL 5 MG/ML IV SOLN: 10.0000 mg | INTRAVENOUS | Status: DC | PRN

## 2021-09-20 MED ORDER — MIDAZOLAM HCL 2 MG/2ML IJ SOLN
INTRAMUSCULAR | Status: DC | PRN
  Administered 2021-09-20: 2 mg via INTRAVENOUS

## 2021-09-20 MED ORDER — SODIUM CHLORIDE 0.9 % WEIGHT BASED INFUSION: 3.0000 mL/kg/h | INTRAVENOUS | Status: DC

## 2021-09-20 MED ORDER — HYDRALAZINE HCL 20 MG/ML IJ SOLN: 10.0000 mg | INTRAMUSCULAR | Status: DC | PRN

## 2021-09-20 MED ORDER — ASPIRIN 81 MG PO CHEW
81.0000 mg | CHEWABLE_TABLET | ORAL | Status: AC
  Administered 2021-09-20: 81 mg via ORAL
  Filled 2021-09-20: qty 1

## 2021-09-20 MED ORDER — MIDAZOLAM HCL 2 MG/2ML IJ SOLN
INTRAMUSCULAR | Status: AC
  Filled 2021-09-20: qty 2

## 2021-09-20 MED ORDER — HEPARIN (PORCINE) IN NACL 1000-0.9 UT/500ML-% IV SOLN
INTRAVENOUS | Status: DC | PRN
  Administered 2021-09-20 (×2): 500 mL

## 2021-09-20 MED ORDER — HEPARIN (PORCINE) IN NACL 1000-0.9 UT/500ML-% IV SOLN
INTRAVENOUS | Status: AC
  Filled 2021-09-20: qty 1000

## 2021-09-20 MED ORDER — SODIUM CHLORIDE 0.9% FLUSH: 3.0000 mL | INTRAVENOUS | Status: DC | PRN

## 2021-09-20 MED ORDER — HEPARIN SODIUM (PORCINE) 1000 UNIT/ML IJ SOLN
INTRAMUSCULAR | Status: AC
  Filled 2021-09-20: qty 1

## 2021-09-20 SURGICAL SUPPLY — 10 items
CATH 5FR JL3.5 JR4 ANG PIG MP (CATHETERS) ×1 IMPLANT
DEVICE RAD COMP TR BAND LRG (VASCULAR PRODUCTS) ×1 IMPLANT
GLIDESHEATH SLEND SS 6F .021 (SHEATH) ×1 IMPLANT
GUIDEWIRE INQWIRE 1.5J.035X260 (WIRE) IMPLANT
INQWIRE 1.5J .035X260CM (WIRE) ×2
KIT HEART LEFT (KITS) ×2 IMPLANT
PACK CARDIAC CATHETERIZATION (CUSTOM PROCEDURE TRAY) ×2 IMPLANT
SYR MEDRAD MARK 7 150ML (SYRINGE) ×2 IMPLANT
TRANSDUCER W/STOPCOCK (MISCELLANEOUS) ×2 IMPLANT
TUBING CIL FLEX 10 FLL-RA (TUBING) ×2 IMPLANT

## 2021-09-20 NOTE — Discharge Summary (Signed)
Discharge Summary    Patient ID: [name] [name] MRN: [id number]; DOB: [birth year]  Admit date: 09/19/2021 Discharge date: 09/20/2021  PCP:  Asencion Noble, MD   [facility] Providers Cardiologist:  Rozann Lesches, MD  Discharge Diagnoses    Active Problems:   Hyperlipidemia   Chest pain   Angina at rest [name] [name] & [name] [name] [facility])    Diagnostic Studies/Procedures    Cath: 09/20/21  Widely patent coronary arteries without any significant stenoses Normal LVEDP _____________   History of Present Illness     [name] [name] is a 60 y.o. male with  history of hypertension, hyperlipidemia, and prostate cancer who presented to the [facility] ER reporting recent onset recurring chest discomfort and shortness of breath out of proportion to activity.  He reported a longstanding history of hypertension managed by Dr. Willey Blade, most recently switched from Norvasc due to leg swelling to olmesartan with reportedly good blood pressure until recent admission. He stated that he woke up the Saturday prior to admission with a headache, later on that day started experiencing a dull discomfort in his chest and shortness of breath with even simple activities, very symptomatic with walking up 12 steps.  Symptoms waxed and waned, recurred later in the weekend when he went shopping at [facility] and had to stop while walking through the store.  He has continued to have similar symptoms and came to the ER today, also concerned that his blood pressure had been suddenly higher and not coming down despite taking regular medications.  He has no documented history of ischemic heart disease.   Work-up in the ER includes normal high-sensitivity troponin I levels and no acute ST segment changes by ECG.  Chest x-ray reports no acute process. He was seen by Dr. Domenic Polite and transferred to [facility] for further management.   Hospital Course     Chest Pain/Dyspnea on Exertion: hsTn remained negative. Symptoms were concerning for unstable angina.  He was transferred [facility] for cardiac cath noted above with no significant CAD. It was felt noncardiac etiology for initial symptoms.   HTN: His BP was initially elevated at 178/93 on admission but improved.  -- Continue Irbesartan 37.5mg  daily.    HLD: Total cholesterol of 188, HDL 36 and LDL 124. He was started on Atorvastatin 80mg  daily initially, but reduced to 20mg  daily given no significant CAD on cath. -- Will need repeat FLP and LFT's in 6-8 weeks.   Patient was seen by Dr. Burt Knack and deemed stable for discharge home. Follow up with PCP as an outpatient, cardiology as needed.   Did the patient have an acute coronary syndrome (MI, NSTEMI, STEMI, etc) this admission?:  No                               Did the patient have a percutaneous coronary intervention (stent / angioplasty)?:  No.    _____________  Discharge Vitals Blood pressure (!) 156/89, pulse 63, temperature 98.2 F (36.8 C), temperature source Oral, resp. rate 19, height 6' (1.829 m), weight 113.4 kg, SpO2 100 %.  Filed Weights   09/19/21 1000  Weight: 113.4 kg    Labs & Radiologic Studies    CBC Recent Labs    09/19/21 1025 09/20/21 0421  WBC 5.9 6.4  NEUTROABS 2.9  --   HGB 15.6 14.9  HCT 47.2 45.1  MCV 89.6 89.1  PLT 193 409   Basic Metabolic Panel Recent Labs    09/19/21  1025 09/20/21 0421  NA 137 138  K 3.8 3.7  CL 104 107  CO2 27 25  GLUCOSE 100* 102*  BUN 17 17  CREATININE 1.19 1.13  CALCIUM 9.0 8.6*  MG  --  2.1  PHOS  --  3.2   Liver Function Tests Recent Labs    09/19/21 1025  AST 18  ALT 16  ALKPHOS 52  BILITOT 0.8  PROT 7.1  ALBUMIN 4.4   No results for input(s): LIPASE, AMYLASE in the last 72 hours. High Sensitivity Troponin:   Recent Labs  Lab 09/19/21 1025 09/19/21 1221 09/19/21 1449  TROPONINIHS 5 5 6     BNP Invalid input(s): POCBNP D-Dimer Recent Labs    09/19/21 1025  DDIMER 0.35   Hemoglobin A1C No results for input(s): HGBA1C in the last 72  hours. Fasting Lipid Panel Recent Labs    09/20/21 0421  CHOL 188  HDL 36*  LDLCALC 124*  TRIG 138  CHOLHDL 5.2   Thyroid Function Tests No results for input(s): TSH, T4TOTAL, T3FREE, THYROIDAB in the last 72 hours.  Invalid input(s): FREET3 _____________  DG Chest 2 View  Result Date: 09/19/2021 CLINICAL DATA:  Chest pain and shortness of breath today. EXAM: CHEST - 2 VIEW COMPARISON:  01/08/2008. FINDINGS: Cardiac silhouette is normal in size. Normal mediastinal and hilar contours. Mild linear opacities in the left lateral lung base consistent with scarring. Lungs otherwise clear. No pleural effusion or pneumothorax. Skeletal structures intact. Stable changes from a prior anterior cervical spine fusion. IMPRESSION: No active cardiopulmonary disease. Electronically Signed   By: Lajean Manes M.D.   On: 09/19/2021 11:03   CARDIAC CATHETERIZATION  Result Date: 09/20/2021 Widely patent coronary arteries without any significant stenoses Normal LVEDP Suspect noncardiac symptoms   Disposition   Pt is being discharged home today in good condition.  Follow-up Plans & Appointments     Follow-up Information     Asencion Noble, MD Follow up.   Specialty: Internal Medicine Why: please arrange for follow up within the next 2 weeks Contact information: [address] [place] [address] [phone]         Satira Sark, MD Follow up.   Specialty: Cardiology Why: as needed Contact information: [place] [address] [phone]                  Discharge Medications   Allergies as of 09/20/2021       Reactions   Celecoxib    Pt unsure    Penicillins    Childhood    Sulfonamide Derivatives    REACTION: unknown reaction        Medication List     TAKE these medications    atorvastatin 20 MG tablet Commonly known as: Lipitor Take 1 tablet (20 mg total) by mouth daily.   fluticasone 50 MCG/ACT nasal spray Commonly known  as: FLONASE Place 1-2 sprays into both nostrils daily.   olmesartan 20 MG tablet Commonly known as: BENICAR Take 20 mg by mouth daily.   sildenafil 20 MG tablet Commonly known as: REVATIO 1-5 po qday prn   tamsulosin 0.4 MG Caps capsule Commonly known as: FLOMAX Take 1 capsule (0.4 mg total) by mouth daily after supper.        Outstanding Labs/Studies   FLP/LFTs in 8 weeks  Duration of Discharge Encounter   Greater than 30 minutes including physician time.  Signed, Reino Bellis, NP 09/20/2021, 3:49 PM

## 2021-09-20 NOTE — ED Notes (Signed)
Pt states he would rather not take his nitro due to the headache it gives him. Will continue to encourage and educate about the purpose of nitro

## 2021-09-20 NOTE — Progress Notes (Addendum)
Progress Note  Patient Name: [name] [name] Date of Encounter: 09/20/2021  [facility] Cardiologist: [name] Lesches, MD   Subjective   No chest pain or dyspnea overnight. Has been NPO since midnight for planned cardiac catheterization today.   Inpatient Medications    Scheduled Meds:  aspirin EC  81 mg Oral Daily   atorvastatin  80 mg Oral Daily   enoxaparin (LOVENOX) injection  40 mg Subcutaneous Q24H   irbesartan  37.5 mg Oral Daily   Continuous Infusions:  sodium chloride 1 mL/kg/hr (09/20/21 0606)   PRN Meds: acetaminophen, melatonin, morphine injection, nitroGLYCERIN, ondansetron (ZOFRAN) IV, oxyCODONE, polyethylene glycol   Vital Signs    Vitals:   09/19/21 2200 09/20/21 0000 09/20/21 0200 09/20/21 0541  BP: (!) 155/89 131/77 140/86 129/85  Pulse: (!) 57 76 67 (!) 56  Resp: 16 14 15 13   Temp:      TempSrc:      SpO2: 98% 98% 96% 96%  Weight:      Height:       No intake or output data in the 24 hours ending 09/20/21 0834 Last 3 Weights 09/19/2021 03/27/2021 03/14/2021  Weight (lbs) 250 lb 246 lb 9.6 oz 244 lb 14.9 oz  Weight (kg) 113.399 kg 111.857 kg 111.1 kg      Telemetry    NSR, HR in 50's to 60's.  - Personally Reviewed  ECG    NSR, HR 64 with slight TWI along Lead III.  - Personally Reviewed  Physical Exam   GEN: Pleasant male appearing in no acute distress.   Neck: No JVD Cardiac: RRR, no murmurs, rubs, or gallops.  Respiratory: Clear to auscultation bilaterally. GI: Soft, nontender, non-distended  MS: No pitting edema; No deformity. Neuro:  Nonfocal  Psych: Normal affect   Labs    High Sensitivity Troponin:   Recent Labs  Lab 09/19/21 1025 09/19/21 1221 09/19/21 1449  TROPONINIHS 5 5 6      Chemistry Recent Labs  Lab 09/19/21 1025 09/20/21 0421  NA 137 138  K 3.8 3.7  CL 104 107  CO2 27 25  GLUCOSE 100* 102*  BUN 17 17  CREATININE 1.19 1.13  CALCIUM 9.0 8.6*  MG  --  2.1  PROT 7.1  --   ALBUMIN 4.4  --   AST  18  --   ALT 16  --   ALKPHOS 52  --   BILITOT 0.8  --   GFRNONAA >60 >60  ANIONGAP 6 6    Lipids  Recent Labs  Lab 09/20/21 0421  CHOL 188  TRIG 138  HDL 36*  LDLCALC 124*  CHOLHDL 5.2    Hematology Recent Labs  Lab 09/19/21 1025 09/20/21 0421  WBC 5.9 6.4  RBC 5.27 5.06  HGB 15.6 14.9  HCT 47.2 45.1  MCV 89.6 89.1  MCH 29.6 29.4  MCHC 33.1 33.0  RDW 12.8 12.6  PLT 193 180   Thyroid No results for input(s): TSH, FREET4 in the last 168 hours.  BNPNo results for input(s): BNP, PROBNP in the last 168 hours.  DDimer  Recent Labs  Lab 09/19/21 1025  DDIMER 0.35     Radiology    DG Chest 2 View  Result Date: 09/19/2021 CLINICAL DATA:  Chest pain and shortness of breath today. EXAM: CHEST - 2 VIEW COMPARISON:  01/08/2008. FINDINGS: Cardiac silhouette is normal in size. Normal mediastinal and hilar contours. Mild linear opacities in the left lateral lung base consistent with scarring. Lungs  otherwise clear. No pleural effusion or pneumothorax. Skeletal structures intact. Stable changes from a prior anterior cervical spine fusion. IMPRESSION: No active cardiopulmonary disease. Electronically Signed   By: Lajean Manes M.D.   On: 09/19/2021 11:03    Cardiac Studies   Echocardiogram: Pending  Patient Profile     60 y.o. male w/ PMH of HTN, HLD, prostate cancer (s/p prostatectomy) and family history of CAD who presented to [facility] ED on 09/19/2021 for evaluation of worsening dyspnea on exertion and chest pain.   Assessment & Plan    1. Chest Pain/Dyspnea on Exertion - He reports progressive dyspnea on exertion for the past year but developed associated chest pain this past weekened and also noticed his BP was above his normal baseline.  - EKG without acute ST changes and his Hs Troponin values have been negative. Echocardiogram pending. Given his concerning symptoms, Dr. Domenic [name] recommended a cardiac catheterization and this is scheduled for later today. The patient  understands that risks include but are not limited to stroke (1 in 1000), death (1 in 70), kidney failure [usually temporary] (1 in 500), bleeding (1 in 200), allergic reaction [possibly serious] (1 in 200). He has been NPO since midnight.  - Continue ASA 81mg  daily and Atorvastatin 80mg  daily. No BB given HR in the 50's.   2. HTN - His BP was initially elevated at 178/93, improved to 129/85 on most recent check. Continue Irbesartan 37.5mg  daily.   3. HLD - FLP this AM shows total cholesterol of 188, HDL 36 and LDL 124. He has been started on Atorvastatin 80mg  daily. Will need repeat FLP and LFT's in 6-8 weeks.   For questions or updates, please contact [place] Please consult [email or web address] for contact info under        Signed, [name] Heritage, PA-C  09/20/2021, 8:34 AM      Attending note:  Patient seen and examined.  Case discussed with Ms. Ahmed Prima PA-C, I agree with her above findings.  [name] [name] reports some recurrent, mild chest discomfort this morning.  Cardiac enzymes have been normal and his ECG shows no acute ST segment changes.  He is NPO.  On examination he appears comfortable.  Afebrile, heart rate in the 60s in sinus rhythm by telemetry, systolic blood pressure 056P to 140s.  Lungs are clear.  Cardiac exam with RRR no gallop.  Pertinent lab work includes potassium 3.7, BUN 17, creatinine 1.13, LDL 124, hemoglobin 14.9, platelets 180.  I personally reviewed his ECG from today which shows normal sinus rhythm.  Patient is scheduled for cardiac catheterization following transfer to [facility] today.  Keep n.p.o. for now.  Continue aspirin, Lipitor, Avapro, and Lovenox.  [name] [name], M.D., F.A.C.C.

## 2021-09-20 NOTE — Progress Notes (Signed)
PROGRESS NOTE    [name] [name]  WVP:[id number] DOB: 1960-12-28 DOA: 09/19/2021 PCP: Asencion Noble, MD    Brief Narrative:  [name] [name] is a 60 year old male with past medical history significant for essential hypertension, asthma, hyperlipidemia, nephrolithiasis, neck pain, status post neck surgery, who presented to [facility] ED with complaints of exertional chest pain, centrally located, dull, 8-9 out of 10 in severity, nonradiating and improved with rest.  Associated with dyspnea, nausea with no vomiting.  No palpitations.  The pain has been intermittent.  No prior use of aspirin at home.  First onset over this past weekend while he was walking at [place].  Improved after he rested, lasting a few minutes and resolving spontaneously.  He went to work this morning.  His pain restarted.  With the same pattern.  No heightened stress at work.  No recent lengthy trips.  He presented to the ED for further evaluation.  Endorses recent changes in his blood pressure medication.  He was switched from amlodipine to olmesartan due to bilateral lower extremity edema which was thought to be secondary to Norvasc.    In the ED, temperature 98.0 F, HR 63, RR 16, BP 178/93, SPO2 100% on room air.  Sodium 137, potassium 3.8, chloride 104, CO2 27, glucose 100, BUN 17, creatinine 1.19, AST 18, ALT 16, total bilirubin 0.8.  High sensitive troponin 5> 5.  WBC 5.9, hemoglobin 15.6, platelets 193.  D-dimer 0.35, within normal limits.  COVID-19 PCR negative.  Influenza A/B PCR negative.  Chest x-ray with no acute cardiopulmonary disease process.  EKG with normal sinus rhythm, rate 60, QTc 431, no concerning dynamic changes.  Cardiology, Dr. Domenic Polite was consulted by EDP.  TRH consulted for further evaluation and management of anginal chest pain.    Assessment & Plan:   Active Problems:   Chest pain   Angina at rest [facility])   Anginal chest pain Patient presenting with progressive exertional chest pain, localized  to anterior sternum with associated diaphoresis, shortness of breath, nausea without vomiting.  Initially has been intermittent but occurring more consistently and frequently.  Patient is afebrile without leukocytosis, chest x-ray with no acute disease process.  EKG with no dynamic changes. --Cardiology following, appreciate assistance --Transferring to [facility] for heart catheterization today --Continue aspirin 81 mg p.o. daily, atorvastatin 80 mg p.o. daily --On arrival to [facility], patient will be transferred to the cardiology service as primary and TRH will sign off.  Essential hypertension BP elevated on admission, 178/93, not at goal. --Irbesartan 37.5 mg p.o. daily --Continue aspirin and statin --Continue monitor BP closely, likely will need further adjustments in antihypertensive regimen  Hyperlipidemia: Lipid panel with total cholesterol 188, HDL 36, LDL 124. --Atorvastatin 80 mg p.o. daily  Obesity Body mass index is 33.91 kg/m.  Discussed with patient needs for aggressive lifestyle changes/weight loss as this complicates all facets of care.  Outpatient follow-up with PCP.    DVT prophylaxis: enoxaparin (LOVENOX) injection 40 mg Start: 09/19/21 1415 SCDs Start: 09/19/21 1402   Code Status: Full Code Family Communication: No family present at bedside this morning  Disposition Plan:  Level of care: Telemetry Cardiac Status is: Inpatient  Remains inpatient appropriate because:Ongoing diagnostic testing needed not appropriate for outpatient work up, Unsafe d/c plan, IV treatments appropriate due to intensity of illness or inability to take PO, and Inpatient level of care appropriate due to severity of illness  Dispo:  Patient From: Home  Planned Disposition: Home  Medically stable  for discharge: No      Consultants:  Cardiology, Dr. Domenic Polite  Procedures:  TTE: Pending Left heart catheterization: Pending for today  Antimicrobials:  None   Subjective: Patient  seen examined bedside, resting comfortably.  Cardiology PA present.  Awaiting transfer to [facility] for left heart catheterization this afternoon.  Currently with no chest pain at rest.  No other questions or concerns at this time.  Denies headache, no dizziness, no current chest pain, no current shortness of breath, no palpitations, no abdominal pain, no fever/chills/night sweats, no nausea/vomit/diarrhea.  No acute events overnight per nursing staff.  Just prior to transfer to [facility], primary RN message that patient experiencing 3/10 chest pain.  Repeat EKG stable with no dynamic changes.  Refused Tylenol.  Objective: Vitals:   09/19/21 2200 09/20/21 0000 09/20/21 0200 09/20/21 0541  BP: (!) 155/89 131/77 140/86 129/85  Pulse: (!) 57 76 67 (!) 56  Resp: 16 14 15 13   Temp:      TempSrc:      SpO2: 98% 98% 96% 96%  Weight:      Height:       No intake or output data in the 24 hours ending 09/20/21 1030 Filed Weights   09/19/21 1000  Weight: 113.4 kg    Examination:  General exam: Appears calm and comfortable  Respiratory system: Clear to auscultation. Respiratory effort normal.  On room air Cardiovascular system: S1 & S2 heard, RRR. No JVD, murmurs, rubs, gallops or clicks. No pedal edema. Gastrointestinal system: Abdomen is nondistended, soft and nontender. No organomegaly or masses felt. Normal bowel sounds heard. Central nervous system: Alert and oriented. No focal neurological deficits. Extremities: Symmetric 5 x 5 power. Skin: No rashes, lesions or ulcers Psychiatry: Judgement and insight appear normal. Mood & affect appropriate.     Data Reviewed: I have personally reviewed following labs and imaging studies  CBC: Recent Labs  Lab 09/19/21 1025 09/20/21 0421  WBC 5.9 6.4  NEUTROABS 2.9  --   HGB 15.6 14.9  HCT 47.2 45.1  MCV 89.6 89.1  PLT 193 174   Basic Metabolic Panel: Recent Labs  Lab 09/19/21 1025 09/20/21 0421  NA 137 138  K 3.8 3.7  CL 104 107   CO2 27 25  GLUCOSE 100* 102*  BUN 17 17  CREATININE 1.19 1.13  CALCIUM 9.0 8.6*  MG  --  2.1  PHOS  --  3.2   GFR: Estimated Creatinine Clearance: 89.2 mL/min (by C-G formula based on SCr of 1.13 mg/dL). Liver Function Tests: Recent Labs  Lab 09/19/21 1025  AST 18  ALT 16  ALKPHOS 52  BILITOT 0.8  PROT 7.1  ALBUMIN 4.4   No results for input(s): LIPASE, AMYLASE in the last 168 hours. No results for input(s): AMMONIA in the last 168 hours. Coagulation Profile: No results for input(s): INR, PROTIME in the last 168 hours. Cardiac Enzymes: No results for input(s): CKTOTAL, CKMB, CKMBINDEX, TROPONINI in the last 168 hours. BNP (last 3 results) No results for input(s): PROBNP in the last 8760 hours. HbA1C: No results for input(s): HGBA1C in the last 72 hours. CBG: No results for input(s): GLUCAP in the last 168 hours. Lipid Profile: Recent Labs    09/20/21 0421  CHOL 188  HDL 36*  LDLCALC 124*  TRIG 138  CHOLHDL 5.2   Thyroid Function Tests: No results for input(s): TSH, T4TOTAL, FREET4, T3FREE, THYROIDAB in the last 72 hours. Anemia Panel: No results for input(s): VITAMINB12, FOLATE,  FERRITIN, TIBC, IRON, RETICCTPCT in the last 72 hours. Sepsis Labs: No results for input(s): PROCALCITON, LATICACIDVEN in the last 168 hours.  Recent Results (from the past 240 hour(s))  Resp Panel by RT-PCR (Flu A&B, Covid) Nasopharyngeal Swab     Status: None   Collection Time: 09/19/21  1:38 PM   Specimen: Nasopharyngeal Swab; Nasopharyngeal(NP) swabs in vial transport medium  Result Value Ref Range Status   SARS Coronavirus 2 by RT PCR NEGATIVE NEGATIVE Final    Comment: (NOTE) SARS-CoV-2 target nucleic acids are NOT DETECTED.  The SARS-CoV-2 RNA is generally detectable in upper respiratory specimens during the acute phase of infection. The lowest concentration of SARS-CoV-2 viral copies this assay can detect is 138 copies/mL. A negative result does not preclude  SARS-Cov-2 infection and should not be used as the sole basis for treatment or other patient management decisions. A negative result may occur with  improper specimen collection/handling, submission of specimen other than nasopharyngeal swab, presence of viral mutation(s) within the areas targeted by this assay, and inadequate number of viral copies(<138 copies/mL). A negative result must be combined with clinical observations, patient history, and epidemiological information. The expected result is Negative.  Fact Sheet for Patients:  EntrepreneurPulse.com.au  Fact Sheet for Healthcare Providers:  IncredibleEmployment.be  This test is no t yet approved or cleared by the [place] FDA and  has been authorized for detection and/or diagnosis of SARS-CoV-2 by FDA under an Emergency Use Authorization (EUA). This EUA will remain  in effect (meaning this test can be used) for the duration of the COVID-19 declaration under Section 564(b)(1) of the Act, 21 U.S.C.section 360bbb-3(b)(1), unless the authorization is terminated  or revoked sooner.       Influenza A by PCR NEGATIVE NEGATIVE Final   Influenza B by PCR NEGATIVE NEGATIVE Final    Comment: (NOTE) The Xpert Xpress SARS-CoV-2/FLU/RSV plus assay is intended as an aid in the diagnosis of influenza from Nasopharyngeal swab specimens and should not be used as a sole basis for treatment. Nasal washings and aspirates are unacceptable for Xpert Xpress SARS-CoV-2/FLU/RSV testing.  Fact Sheet for Patients: EntrepreneurPulse.com.au  Fact Sheet for Healthcare Providers: IncredibleEmployment.be  This test is not yet approved or cleared by the [place] FDA and has been authorized for detection and/or diagnosis of SARS-CoV-2 by FDA under an Emergency Use Authorization (EUA). This EUA will remain in effect (meaning this test can be used) for the duration of  the COVID-19 declaration under Section 564(b)(1) of the Act, 21 U.S.C. section 360bbb-3(b)(1), unless the authorization is terminated or revoked.  Performed at [facility], [address] [place], [place] [address]          Radiology Studies: DG Chest 2 View  Result Date: 09/19/2021 CLINICAL DATA:  Chest pain and shortness of breath today. EXAM: CHEST - 2 VIEW COMPARISON:  01/08/2008. FINDINGS: Cardiac silhouette is normal in size. Normal mediastinal and hilar contours. Mild linear opacities in the left lateral lung base consistent with scarring. Lungs otherwise clear. No pleural effusion or pneumothorax. Skeletal structures intact. Stable changes from a prior anterior cervical spine fusion. IMPRESSION: No active cardiopulmonary disease. Electronically Signed   By: Lajean Manes M.D.   On: 09/19/2021 11:03        Scheduled Meds:  aspirin EC  81 mg Oral Daily   atorvastatin  80 mg Oral Daily   enoxaparin (LOVENOX) injection  40 mg Subcutaneous Q24H   irbesartan  37.5 mg Oral Daily   Continuous  Infusions:  sodium chloride 1 mL/kg/hr (09/20/21 0606)     LOS: 0 days    Time spent: 39 minutes spent on chart review, discussion with nursing staff, consultants, updating family and interview/physical exam; more than 50% of that time was spent in counseling and/or coordination of care.    Tresa Jolley J [place] DO [facility] Available via Epic secure chat 7am-7pm After these hours, please refer to coverage provider listed on [email or web address] 09/20/2021, 10:30 AM

## 2021-09-20 NOTE — H&P (View-Only) (Signed)
Progress Note  Patient Name: [name] [name] Date of Encounter: 09/20/2021  [facility] Cardiologist: Rozann Lesches, MD   Subjective   No chest pain or dyspnea overnight. Has been NPO since midnight for planned cardiac catheterization today.   Inpatient Medications    Scheduled Meds:  aspirin EC  81 mg Oral Daily   atorvastatin  80 mg Oral Daily   enoxaparin (LOVENOX) injection  40 mg Subcutaneous Q24H   irbesartan  37.5 mg Oral Daily   Continuous Infusions:  sodium chloride 1 mL/kg/hr (09/20/21 0606)   PRN Meds: acetaminophen, melatonin, morphine injection, nitroGLYCERIN, ondansetron (ZOFRAN) IV, oxyCODONE, polyethylene glycol   Vital Signs    Vitals:   09/19/21 2200 09/20/21 0000 09/20/21 0200 09/20/21 0541  BP: (!) 155/89 131/77 140/86 129/85  Pulse: (!) 57 76 67 (!) 56  Resp: 16 14 15 13   Temp:      TempSrc:      SpO2: 98% 98% 96% 96%  Weight:      Height:       No intake or output data in the 24 hours ending 09/20/21 0834 Last 3 Weights 09/19/2021 03/27/2021 03/14/2021  Weight (lbs) 250 lb 246 lb 9.6 oz 244 lb 14.9 oz  Weight (kg) 113.399 kg 111.857 kg 111.1 kg      Telemetry    NSR, HR in 50's to 60's.  - Personally Reviewed  ECG    NSR, HR 64 with slight TWI along Lead III.  - Personally Reviewed  Physical Exam   GEN: Pleasant male appearing in no acute distress.   Neck: No JVD Cardiac: RRR, no murmurs, rubs, or gallops.  Respiratory: Clear to auscultation bilaterally. GI: Soft, nontender, non-distended  MS: No pitting edema; No deformity. Neuro:  Nonfocal  Psych: Normal affect   Labs    High Sensitivity Troponin:   Recent Labs  Lab 09/19/21 1025 09/19/21 1221 09/19/21 1449  TROPONINIHS 5 5 6      Chemistry Recent Labs  Lab 09/19/21 1025 09/20/21 0421  NA 137 138  K 3.8 3.7  CL 104 107  CO2 27 25  GLUCOSE 100* 102*  BUN 17 17  CREATININE 1.19 1.13  CALCIUM 9.0 8.6*  MG  --  2.1  PROT 7.1  --   ALBUMIN 4.4  --   AST  18  --   ALT 16  --   ALKPHOS 52  --   BILITOT 0.8  --   GFRNONAA >60 >60  ANIONGAP 6 6    Lipids  Recent Labs  Lab 09/20/21 0421  CHOL 188  TRIG 138  HDL 36*  LDLCALC 124*  CHOLHDL 5.2    Hematology Recent Labs  Lab 09/19/21 1025 09/20/21 0421  WBC 5.9 6.4  RBC 5.27 5.06  HGB 15.6 14.9  HCT 47.2 45.1  MCV 89.6 89.1  MCH 29.6 29.4  MCHC 33.1 33.0  RDW 12.8 12.6  PLT 193 180   Thyroid No results for input(s): TSH, FREET4 in the last 168 hours.  BNPNo results for input(s): BNP, PROBNP in the last 168 hours.  DDimer  Recent Labs  Lab 09/19/21 1025  DDIMER 0.35     Radiology    DG Chest 2 View  Result Date: 09/19/2021 CLINICAL DATA:  Chest pain and shortness of breath today. EXAM: CHEST - 2 VIEW COMPARISON:  01/08/2008. FINDINGS: Cardiac silhouette is normal in size. Normal mediastinal and hilar contours. Mild linear opacities in the left lateral lung base consistent with scarring. Lungs  otherwise clear. No pleural effusion or pneumothorax. Skeletal structures intact. Stable changes from a prior anterior cervical spine fusion. IMPRESSION: No active cardiopulmonary disease. Electronically Signed   By: Lajean Manes M.D.   On: 09/19/2021 11:03    Cardiac Studies   Echocardiogram: Pending  Patient Profile     60 y.o. male w/ PMH of HTN, HLD, prostate cancer (s/p prostatectomy) and family history of CAD who presented to [facility] ED on 09/19/2021 for evaluation of worsening dyspnea on exertion and chest pain.   Assessment & Plan    1. Chest Pain/Dyspnea on Exertion - He reports progressive dyspnea on exertion for the past year but developed associated chest pain this past weekened and also noticed his BP was above his normal baseline.  - EKG without acute ST changes and his Hs Troponin values have been negative. Echocardiogram pending. Given his concerning symptoms, Dr. Domenic Polite recommended a cardiac catheterization and this is scheduled for later today. The patient  understands that risks include but are not limited to stroke (1 in 1000), death (1 in 29), kidney failure [usually temporary] (1 in 500), bleeding (1 in 200), allergic reaction [possibly serious] (1 in 200). He has been NPO since midnight.  - Continue ASA 81mg  daily and Atorvastatin 80mg  daily. No BB given HR in the 50's.   2. HTN - His BP was initially elevated at 178/93, improved to 129/85 on most recent check. Continue Irbesartan 37.5mg  daily.   3. HLD - FLP this AM shows total cholesterol of 188, HDL 36 and LDL 124. He has been started on Atorvastatin 80mg  daily. Will need repeat FLP and LFT's in 6-8 weeks.   For questions or updates, please contact [place] Please consult [email or web address] for contact info under        Signed, Erma Heritage, PA-C  09/20/2021, 8:34 AM      Attending note:  Patient seen and examined.  Case discussed with Ms. Ahmed Prima PA-C, I agree with her above findings.  Mr. Beauchaine reports some recurrent, mild chest discomfort this morning.  Cardiac enzymes have been normal and his ECG shows no acute ST segment changes.  He is NPO.  On examination he appears comfortable.  Afebrile, heart rate in the 60s in sinus rhythm by telemetry, systolic blood pressure 572I to 140s.  Lungs are clear.  Cardiac exam with RRR no gallop.  Pertinent lab work includes potassium 3.7, BUN 17, creatinine 1.13, LDL 124, hemoglobin 14.9, platelets 180.  I personally reviewed his ECG from today which shows normal sinus rhythm.  Patient is scheduled for cardiac catheterization following transfer to [facility] today.  Keep n.p.o. for now.  Continue aspirin, Lipitor, Avapro, and Lovenox.  Satira Sark, M.D., F.A.C.C.

## 2021-09-20 NOTE — Interval H&P Note (Signed)
Cath Lab Visit (complete for each Cath Lab visit)  Clinical Evaluation Leading to the Procedure:   ACS: No.  Non-ACS:    Anginal Classification: CCS III  Anti-ischemic medical therapy: Minimal Therapy (1 class of medications)  Non-Invasive Test Results: No non-invasive testing performed  Prior CABG: No previous CABG      History and Physical Interval Note:  09/20/2021 2:08 PM  [name] [name]  has presented today for surgery, with the diagnosis of [place].  The various methods of treatment have been discussed with the patient and family. After consideration of risks, benefits and other options for treatment, the patient has consented to  Procedure(s): LEFT HEART CATH AND CORONARY ANGIOGRAPHY (N/A) as a surgical intervention.  The patient's history has been reviewed, patient examined, no change in status, stable for surgery.  I have reviewed the patient's chart and labs.  Questions were answered to the patient's satisfaction.     Sherren Mocha

## 2021-09-20 NOTE — ED Notes (Signed)
Dr. [place] updated about pt chest pain at this time. Pt appears stable. No SOB, but does complain of mild headache. Refused tylenol prn. New ekg obtained

## 2021-09-20 NOTE — ED Notes (Signed)
Pt has been NPO since midnight.

## 2021-09-21 ENCOUNTER — Encounter (HOSPITAL_COMMUNITY): Payer: Self-pay | Admitting: Cardiovascular Disease

## 2021-09-26 MED FILL — Verapamil HCl IV Soln 2.5 MG/ML: INTRAVENOUS | Qty: 2 | Status: AC

## 2021-10-04 DIAGNOSIS — E785 Hyperlipidemia, unspecified: Secondary | ICD-10-CM | POA: Diagnosis not present

## 2021-10-04 DIAGNOSIS — R079 Chest pain, unspecified: Secondary | ICD-10-CM | POA: Diagnosis not present

## 2021-10-04 DIAGNOSIS — I10 Essential (primary) hypertension: Secondary | ICD-10-CM | POA: Diagnosis not present

## 2021-10-26 DIAGNOSIS — R051 Acute cough: Secondary | ICD-10-CM | POA: Diagnosis not present

## 2021-12-19 DIAGNOSIS — I10 Essential (primary) hypertension: Secondary | ICD-10-CM | POA: Diagnosis not present

## 2021-12-19 DIAGNOSIS — Z8500 Personal history of malignant neoplasm of unspecified digestive organ: Secondary | ICD-10-CM | POA: Diagnosis not present

## 2021-12-19 DIAGNOSIS — J45909 Unspecified asthma, uncomplicated: Secondary | ICD-10-CM | POA: Diagnosis not present

## 2021-12-19 DIAGNOSIS — Z79899 Other long term (current) drug therapy: Secondary | ICD-10-CM | POA: Diagnosis not present

## 2021-12-19 DIAGNOSIS — E785 Hyperlipidemia, unspecified: Secondary | ICD-10-CM | POA: Diagnosis not present

## 2021-12-25 DIAGNOSIS — R079 Chest pain, unspecified: Secondary | ICD-10-CM | POA: Diagnosis not present

## 2021-12-25 DIAGNOSIS — I700 Atherosclerosis of aorta: Secondary | ICD-10-CM | POA: Diagnosis not present

## 2021-12-25 DIAGNOSIS — E785 Hyperlipidemia, unspecified: Secondary | ICD-10-CM | POA: Diagnosis not present

## 2021-12-25 DIAGNOSIS — Z8500 Personal history of malignant neoplasm of unspecified digestive organ: Secondary | ICD-10-CM | POA: Diagnosis not present

## 2021-12-25 DIAGNOSIS — I10 Essential (primary) hypertension: Secondary | ICD-10-CM | POA: Diagnosis not present

## 2021-12-28 ENCOUNTER — Encounter: Payer: Self-pay | Admitting: *Deleted

## 2022-02-12 ENCOUNTER — Ambulatory Visit (INDEPENDENT_AMBULATORY_CARE_PROVIDER_SITE_OTHER): Payer: Self-pay | Admitting: *Deleted

## 2022-02-12 ENCOUNTER — Other Ambulatory Visit: Payer: Self-pay

## 2022-02-12 VITALS — Ht 72.0 in | Wt 260.4 lb

## 2022-02-12 DIAGNOSIS — Z1211 Encounter for screening for malignant neoplasm of colon: Secondary | ICD-10-CM

## 2022-02-12 MED ORDER — NA SULFATE-K SULFATE-MG SULF 17.5-3.13-1.6 GM/177ML PO SOLN: 1.0000 | Freq: Once | ORAL | 0 refills | Status: AC

## 2022-02-12 NOTE — Progress Notes (Signed)
Pt having some issues with " questionable anal fissure".  Pain with bowel movements.  Wants to know if he can get ointment to help.  Informed him that he may need office visit to discuss further.  Pt informed me of these issues after triage questions were completed.

## 2022-02-12 NOTE — Progress Notes (Signed)
Gastroenterology Pre-Procedure Review  Request Date: 02/12/2022 Requesting Physician: Dr. Willey Blade, Last TCS 05/24/2010 by Dr. Gala Romney, hyperplastic polyp, 10 year repeat recommended  PATIENT REVIEW QUESTIONS: The patient responded to the following health history questions as indicated:    1. Diabetes Melitis: no 2. Joint replacements in the past 12 months: no 3. Major health problems in the past 3 months: yes, elevated blood pressure, PCP managing it 4. Has an artificial valve or MVP: no 5. Has a defibrillator: no 6. Has been advised in past to take antibiotics in advance of a procedure like teeth cleaning: no 7. Family history of colon cancer: no  8. Alcohol Use: yes, 3-5 beers weekly 9. Illicit drug Use: no 10. History of sleep apnea: no 11. History of coronary artery or other vascular stents placed within the last 12 months: no 12. History of any prior anesthesia complications: yes, wakes up during procedure-last TCS and kidney stone removal 13. Body mass index is 35.32 kg/m.    MEDICATIONS & ALLERGIES:    Patient reports the following regarding taking any blood thinners:   Plavix? no Aspirin? no Coumadin? no Brilinta? no Xarelto? no Eliquis? no Pradaxa? no Savaysa? no Effient? no  Patient confirms/reports the following medications:  Current Outpatient Medications  Medication Sig Dispense Refill   atorvastatin (LIPITOR) 20 MG tablet Take 1 tablet (20 mg total) by mouth daily. (Patient taking differently: Take 40 mg by mouth daily. Takes 40 mg daily.) 30 tablet 11   fluticasone (FLONASE) 50 MCG/ACT nasal spray Place 1-2 sprays into both nostrils as needed.     olmesartan (BENICAR) 20 MG tablet Take 20 mg by mouth daily.     No current facility-administered medications for this visit.    Patient confirms/reports the following allergies:  Allergies  Allergen Reactions   Celecoxib     Pt unsure    Penicillins     Childhood    Sulfonamide Derivatives     REACTION: unknown  reaction    No orders of the defined types were placed in this encounter.   AUTHORIZATION INFORMATION Primary Insurance: [place],  [place] #: [id number],  Group #: [id number] Pre-Cert / Josem Kaufmann required:  Pre-Cert / Auth #:   SCHEDULE INFORMATION: Procedure has been scheduled as follows:  Date: , Time:   Location: [facility] with Dr. Gala Romney  This Gastroenterology Pre-Precedure Review Form is being routed to the following provider(s): Roseanne Kaufman, NP

## 2022-02-19 NOTE — Progress Notes (Signed)
Needs office visit.

## 2022-03-03 ENCOUNTER — Ambulatory Visit
Admission: EM | Admit: 2022-03-03 | Discharge: 2022-03-03 | Disposition: A | Discharge status: Home / Self Care | Payer: BC Managed Care – PPO

## 2022-03-03 ENCOUNTER — Ambulatory Visit (INDEPENDENT_AMBULATORY_CARE_PROVIDER_SITE_OTHER): Payer: BC Managed Care – PPO

## 2022-03-03 DIAGNOSIS — R0602 Shortness of breath: Secondary | ICD-10-CM

## 2022-03-03 DIAGNOSIS — R059 Cough, unspecified: Secondary | ICD-10-CM

## 2022-03-03 DIAGNOSIS — R062 Wheezing: Secondary | ICD-10-CM | POA: Diagnosis not present

## 2022-03-03 DIAGNOSIS — J4530 Mild persistent asthma, uncomplicated: Secondary | ICD-10-CM

## 2022-03-03 DIAGNOSIS — R079 Chest pain, unspecified: Secondary | ICD-10-CM | POA: Diagnosis not present

## 2022-03-03 DIAGNOSIS — R0989 Other specified symptoms and signs involving the circulatory and respiratory systems: Secondary | ICD-10-CM | POA: Diagnosis not present

## 2022-03-03 DIAGNOSIS — J019 Acute sinusitis, unspecified: Secondary | ICD-10-CM

## 2022-03-03 DIAGNOSIS — R051 Acute cough: Secondary | ICD-10-CM

## 2022-03-03 MED ORDER — PREDNISONE 20 MG PO TABS: ORAL_TABLET | ORAL | 0 refills | Status: DC

## 2022-03-03 MED ORDER — BENZONATATE 100 MG PO CAPS: 100.0000 mg | ORAL_CAPSULE | Freq: Three times a day (TID) | ORAL | 0 refills | Status: DC | PRN

## 2022-03-03 MED ORDER — PROMETHAZINE-DM 6.25-15 MG/5ML PO SYRP: 5.0000 mL | ORAL_SOLUTION | Freq: Every evening | ORAL | 0 refills | Status: DC | PRN

## 2022-03-03 MED ORDER — METHYLPREDNISOLONE SODIUM SUCC 125 MG IJ SOLR
125.0000 mg | Freq: Once | INTRAMUSCULAR | Status: DC
Start: 2022-03-03 — End: 2022-03-03

## 2022-03-03 MED ORDER — LEVOCETIRIZINE DIHYDROCHLORIDE 5 MG PO TABS: 5.0000 mg | ORAL_TABLET | Freq: Every evening | ORAL | 0 refills | Status: DC

## 2022-03-03 NOTE — ED Triage Notes (Signed)
Pt reports cough, shortness of breath,  sinus pressure, nasal congestion and chest congestion x 1 week; right sided back pain since this morning. Pt is in day # 6 of doxycyline.   ?

## 2022-03-03 NOTE — ED Provider Notes (Signed)
?[facility] ? ? ?MRN: [id number] DOB: 1961-06-30 ? ?Subjective:  ? ?[name] [name] is a 61 y.o. male presenting for 1 week history of persistent sinus congestion, coughing, now having shortness of breath, right-sided chest pain that radiates to the thoracic region.  He was seen through his primary care doctor's office and prescribed doxycycline for sinus infection.  However he continues to have symptoms.  Reports that his congestion is slightly better but is worried about his lower respiratory system.  He does have a history of asthma, has an albuterol inhaler that he could use at home. ? ?No current facility-administered medications for this encounter. ? ?Current Outpatient Medications:  ?  acetaminophen (TYLENOL) 500 MG tablet, Take 500 mg by mouth as needed., Disp: , Rfl:  ?  albuterol (VENTOLIN HFA) 108 (90 Base) MCG/ACT inhaler, SMARTSIG:2 Puff(s) By Mouth, Disp: , Rfl:  ?  atorvastatin (LIPITOR) 20 MG tablet, Take 1 tablet (20 mg total) by mouth daily. (Patient taking differently: Take 40 mg by mouth daily. Takes 40 mg daily.), Disp: 30 tablet, Rfl: 11 ?  doxycycline (VIBRAMYCIN) 100 MG capsule, Take 100 mg by mouth 2 (two) times daily., Disp: , Rfl:  ?  fluticasone (FLONASE) 50 MCG/ACT nasal spray, Place 1-2 sprays into both nostrils as needed., Disp: , Rfl:  ?  olmesartan (BENICAR) 20 MG tablet, Take 20 mg by mouth daily., Disp: , Rfl:  ?  olmesartan (BENICAR) 40 MG tablet, Take 40 mg by mouth daily., Disp: , Rfl:   ? ?Allergies  ?Allergen Reactions  ? Celecoxib   ?  Pt unsure   ? Penicillins   ?  Childhood   ? Sulfonamide Derivatives   ?  REACTION: unknown reaction  ? ? ?Past Medical History:  ?Diagnosis Date  ? Childhood asthma   ? Diverticulitis   ? History of kidney stones   ? Hypercholesteremia   ? Hypertension   ? Prostate cancer ([place])   ?  ? ?Past Surgical History:  ?Procedure Laterality Date  ? EXTRACORPOREAL SHOCK WAVE LITHOTRIPSY Left 03/14/2021  ? Procedure: EXTRACORPOREAL SHOCK  WAVE LITHOTRIPSY (ESWL);  Surgeon: Cleon Gustin, MD;  Location: [facility];  Service: Urology;  Laterality: Left;  ? LEFT HEART CATH AND CORONARY ANGIOGRAPHY N/A 09/20/2021  ? Procedure: LEFT HEART CATH AND CORONARY ANGIOGRAPHY;  Surgeon: Sherren Mocha, MD;  Location: [place] [facility];  Service: Cardiovascular;  Laterality: N/A;  ? NECK SURGERY    ? ROBOT ASSISTED LAPAROSCOPIC RADICAL PROSTATECTOMY  2009  ? SPINE SURGERY    ? ? ?Family History  ?Problem Relation Age of Onset  ? Hypertension Father   ? ? ?Social History  ? ?Tobacco Use  ? Smoking status: Former  ?  Packs/day: 2.00  ?  Years: 40.00  ?  Pack years: 80.00  ?  Types: Cigarettes  ?  Quit date: 12/03/1979  ?  Years since quitting: 42.2  ? Smokeless tobacco: Never  ?Vaping Use  ? Vaping Use: Never used  ?Substance Use Topics  ? Alcohol use: Yes  ?  Alcohol/week: 2.0 standard drinks  ?  Types: 2 Cans of beer per week  ?  Comment: couple drinks per week  ? Drug use: Not Currently  ? ? ?ROS ? ? ?Objective:  ? ?Vitals: ?BP (!) 149/82 (BP Location: Right Arm)   Pulse 93   Temp 98.5 ?F (36.9 ?C) (Oral)   Resp (!) 24   SpO2 96%  ? ?Physical Exam ?Constitutional:   ?  General: He is not in acute distress. ?   Appearance: Normal appearance. He is well-developed. He is not ill-appearing, toxic-appearing or diaphoretic.  ?HENT:  ?   Head: Normocephalic and atraumatic.  ?   Right Ear: External ear normal.  ?   Left Ear: External ear normal.  ?   Nose: Nose normal.  ?   Mouth/Throat:  ?   Mouth: Mucous membranes are moist.  ?Eyes:  ?   General: No scleral icterus.    ?   Right eye: No discharge.     ?   Left eye: No discharge.  ?   Extraocular Movements: Extraocular movements intact.  ?Cardiovascular:  ?   Rate and Rhythm: Normal rate and regular rhythm.  ?   Heart sounds: Normal heart sounds. No murmur heard. ?  No friction rub. No gallop.  ?Pulmonary:  ?   Effort: Pulmonary effort is normal. No respiratory distress.  ?   Breath sounds: No stridor. Wheezing  (diffuse throughout) present. No rhonchi or rales.  ?Neurological:  ?   Mental Status: He is alert and oriented to person, place, and time.  ?Psychiatric:     ?   Mood and Affect: Mood normal.     ?   Behavior: Behavior normal.     ?   Thought Content: Thought content normal.  ? ?IM Solu-Medrol in clinic. ? ?Assessment and Plan :  ? ?PDMP not reviewed this encounter. ? ?1. Shortness of breath   ?2. Wheezing   ?3. Acute cough   ?4. Mild persistent asthma without complication   ?5. Right-sided chest pain   ?6. Acute non-recurrent sinusitis, unspecified location   ? ?X-ray over-read was pending at time of discharge, recommended follow up with only abnormal results. Otherwise will not call for negative over-read. Patient was in agreement.  Maintain doxycycline for now.  Recommended an oral prednisone course in addition to the injection he got in clinic for his respiratory symptoms and in the context of his asthma. Counseled patient on potential for adverse effects with medications prescribed/recommended today, ER and return-to-clinic precautions discussed, patient verbalized understanding. ? ?  ?Jaynee Eagles, PA-C ?03/03/22 1428 ? ?

## 2022-03-05 ENCOUNTER — Ambulatory Visit: Payer: BC Managed Care – PPO | Admitting: Gastroenterology

## 2022-03-05 ENCOUNTER — Encounter: Payer: Self-pay | Admitting: Gastroenterology

## 2022-03-05 ENCOUNTER — Other Ambulatory Visit: Payer: Self-pay

## 2022-03-05 VITALS — BP 140/86 | HR 63 | Temp 96.9°F | Ht 72.0 in | Wt 264.2 lb

## 2022-03-05 DIAGNOSIS — Z1211 Encounter for screening for malignant neoplasm of colon: Secondary | ICD-10-CM

## 2022-03-05 DIAGNOSIS — K649 Unspecified hemorrhoids: Secondary | ICD-10-CM

## 2022-03-05 DIAGNOSIS — K602 Anal fissure, unspecified: Secondary | ICD-10-CM

## 2022-03-05 DIAGNOSIS — J209 Acute bronchitis, unspecified: Secondary | ICD-10-CM

## 2022-03-05 NOTE — Progress Notes (Signed)
GI Office Note    Referring Provider: Carylon Perches, MD Primary Care Physician:  Carylon Perches, MD  Primary Gastroenterologist: Roetta Sessions, MD  Chief Complaint   Chief Complaint  Patient presents with   Colonoscopy    Was having an issue with a fissure but states that it is healed now.    History of Present Illness   [name] [name] is a 61 y.o. male presenting today at the request of Dr. Ouida Sills to schedule colonoscopy.  When we called to triage patient, he complained of rectal pain and asking for an appointment.  Office visit was made. Last colonoscopy in June 2011, he had a hyperplastic polyp.  Recommended repeat colonoscopy in 10 years.  No rectal bleeding. BM regular. High fiber diet. Two months ago noted rectal pain with BMs and afterwards.  Denies passing of hard stool or straining.  Increased fiber more. Started stool softeners. Pain is almost gone. Uses Prep[facility] most days after BMs, just on the outside.  He feels like most of his symptoms now are related to hemorrhoids, itching and burning. Rare reflux symptoms. No n/v. No dysphagia.   Recently completed antibiotic for upper respiratory symptoms.  Was seen in urgent care over the weekend for congestion.  Chest x-ray unremarkable.  Started on steroids.  Patient denies associated shortness of breath.  History of childhood asthma, takes inhaler as needed.   Medications   Current Outpatient Medications  Medication Sig Dispense Refill   acetaminophen (TYLENOL) 500 MG tablet Take 500 mg by mouth as needed.     albuterol (VENTOLIN HFA) 108 (90 Base) MCG/ACT inhaler SMARTSIG:2 Puff(s) By Mouth     atorvastatin (LIPITOR) 20 MG tablet Take 1 tablet (20 mg total) by mouth daily. (Patient taking differently: Take 20 mg by mouth daily. One tablet daily) 30 tablet 11   benzonatate (TESSALON) 100 MG capsule Take 1-2 capsules (100-200 mg total) by mouth 3 (three) times daily as needed for cough. 60 capsule 0   fluticasone (FLONASE) 50  MCG/ACT nasal spray Place 1-2 sprays into both nostrils as needed.     levocetirizine (XYZAL) 5 MG tablet Take 1 tablet (5 mg total) by mouth every evening. 90 tablet 0   olmesartan (BENICAR) 40 MG tablet Take 40 mg by mouth daily.     predniSONE (DELTASONE) 20 MG tablet Take 2 tablets daily with breakfast. 10 tablet 0   No current facility-administered medications for this visit.    Allergies   Allergies as of 03/05/2022 - Review Complete 03/05/2022  Allergen Reaction Noted   Celecoxib     Penicillins     Sulfonamide derivatives      Past Medical History   Past Medical History:  Diagnosis Date   Childhood asthma    Diverticulitis    History of kidney stones    Hypercholesteremia    Hypertension    Prostate cancer [facility])     Past Surgical History   Past Surgical History:  Procedure Laterality Date   EXTRACORPOREAL SHOCK WAVE LITHOTRIPSY Left 03/14/2021   Procedure: EXTRACORPOREAL SHOCK WAVE LITHOTRIPSY (ESWL);  Surgeon: Malen Gauze, MD;  Location: [facility];  Service: Urology;  Laterality: Left;   LEFT HEART CATH AND CORONARY ANGIOGRAPHY N/A 09/20/2021   Procedure: LEFT HEART CATH AND CORONARY ANGIOGRAPHY;  Surgeon: Tonny Bollman, MD;  Location: [facility] INVASIVE CV LAB;  Service: Cardiovascular;  Laterality: N/A;   NECK SURGERY     ROBOT ASSISTED LAPAROSCOPIC RADICAL PROSTATECTOMY  2009   SPINE  SURGERY      Past Family History   Family History  Problem Relation Age of Onset   Hypertension Father     Past Social History   Social History   Socioeconomic History   Marital status: Married    Spouse name: Not on file   Number of children: 1   Years of education: Not on file   Highest education level: Not on file  Occupational History   Occupation: Education administrator  Tobacco Use   Smoking status: Former    Packs/day: 2.00    Years: 40.00    Pack years: 80.00    Types: Cigarettes    Quit date: 12/03/1979    Years since quitting: 42.2   Smokeless  tobacco: Never  Vaping Use   Vaping Use: Never used  Substance and Sexual Activity   Alcohol use: Yes    Alcohol/week: 2.0 standard drinks    Types: 2 Cans of beer per week    Comment: couple drinks per week   Drug use: Not Currently   Sexual activity: Yes  Other Topics Concern   Not on file  Social History Narrative   Not on file   Social Determinants of Health   Financial Resource Strain: Not on file  Food Insecurity: Not on file  Transportation Needs: Not on file  Physical Activity: Not on file  Stress: Not on file  Social Connections: Not on file  Intimate Partner Violence: Not on file    Review of Systems   General: Negative for anorexia, weight loss, fever, chills, fatigue, weakness. Eyes: Negative for vision changes.  ENT: Negative for hoarseness, difficulty swallowing , nasal congestion. CV: Negative for chest pain, angina, palpitations, dyspnea on exertion, peripheral edema.  Respiratory: Negative for dyspnea at rest, dyspnea on exertion. positive for cough, sputum, wheezing.  GI: See history of present illness. GU:  Negative for dysuria, hematuria, urinary incontinence, urinary frequency, nocturnal urination.  MS: Negative for joint pain, low back pain.  Derm: Negative for rash or itching.  Neuro: Negative for weakness, abnormal sensation, seizure, frequent headaches, memory loss,  confusion.  Psych: Negative for anxiety, depression, suicidal ideation, hallucinations.  Endo: Negative for unusual weight change.  Heme: Negative for bruising or bleeding. Allergy: Negative for rash or hives.  Physical Exam   BP 140/86 (BP Location: Right Arm, Patient Position: Sitting, Cuff Size: Large)   Pulse 63   Temp (!) 96.9 F (36.1 C) (Temporal)   Ht 6' (1.829 m)   Wt 264 lb 3.2 oz (119.8 kg)   SpO2 98%   BMI 35.83 kg/m    General: Well-nourished, well-developed in no acute distress.  Head: Normocephalic, atraumatic.   Eyes: Conjunctiva pink, no icterus. Mouth:  masked. Neck: Supple without thyromegaly, masses, or lymphadenopathy.  Lungs: Bilateral scattered wheezes, rhonchi in bases. Heart: Regular rate and rhythm, no murmurs rubs or gallops.  Abdomen: Bowel sounds are normal, nontender, nondistended, no hepatosplenomegaly or masses,  no abdominal bruits or hernia, no rebound or guarding.   Rectal: not performed Extremities: No lower extremity edema. No clubbing or deformities.  Neuro: Alert and oriented x 4 , grossly normal neurologically.  Skin: Warm and dry, no rash or jaundice.   Psych: Alert and cooperative, normal mood and affect.  Labs   Lab Results  Component Value Date   CREATININE 1.13 09/20/2021   BUN 17 09/20/2021   NA 138 09/20/2021   K 3.7 09/20/2021   CL 107 09/20/2021   CO2 25 09/20/2021  Lab Results  Component Value Date   ALT 16 09/19/2021   AST 18 09/19/2021   ALKPHOS 52 09/19/2021   BILITOT 0.8 09/19/2021   Lab Results  Component Value Date   WBC 6.4 09/20/2021   HGB 14.9 09/20/2021   HCT 45.1 09/20/2021   MCV 89.1 09/20/2021   PLT 180 09/20/2021     Imaging Studies   DG Chest 2 View  Result Date: 03/03/2022 CLINICAL DATA:  Cough, right chest pain EXAM: CHEST - 2 VIEW COMPARISON:  09/19/2021 FINDINGS: Cardiac size is within normal limits. There are no signs of pulmonary edema or focal pulmonary consolidation. Small transverse linear density in the lateral aspect of left lower lung fields has not changed suggesting minimal scarring. There is previous surgical fusion in the lower cervical spine. IMPRESSION: No active cardiopulmonary disease. Electronically Signed   By: Ernie Avena M.D.   On: 03/03/2022 14:34    Assessment   [name] [name] is a 62 y.o. male presenting today to schedule 10-year screening colonoscopy.  Last colonoscopy in 2011.  He had insurance issues last year and not able to complete colonoscopy at that time.  From a GI standpoint, he has had some intermittent rectal pain similar  to anal fissure he had years ago.  This is feeling better but continues to have itching and burning which may be due to internal hemorrhoids.  Bowel movements are regular.  Denies straining or hard stools.    Currently he is being treated for upper respiratory symptoms, completed antibiotics but now on steroids.  He has bilateral scattered wheezes, rhonchi in the bases.  Denies shortness of breath.  He plans to reach out to PCP if symptoms persist once he has completed prednisone.  We will plan on scheduling a colonoscopy in 3 to 4 weeks to allow him time to recuperate.  Reports history of waking up during lithotripsy as well as his last colonoscopy.  He received conscious sedation.   PLAN   Colonoscopy with Dr. Jena Gauss, ASA 2.  I have discussed the risks, alternatives, benefits with regards to but not limited to the risk of reaction to medication, bleeding, infection, perforation and the patient is agreeable to proceed. Written consent to be obtained. He will reach out to PCP if upper respiratory symptoms do not resolve. We will continue Preparation H as needed, consider anorectal application in order to manage internal hemorrhoids.  We discussed prescription options including compounded creams with nitroglycerin/lidocaine/steroid but at this time he feels like he is doing well.   Leanna Battles. Melvyn Neth, MHS, PA-C [facility]

## 2022-03-05 NOTE — Patient Instructions (Signed)
Colonoscopy to be scheduled. See separate instructions. ?If you are not feeling better in the next couple of days with regards to congestion, please call Dr. Willey Blade. ?You can continue PrepH as needed, you can insert a small amount just in side rectum/anus with a gloved finger if needed. If you decide you want RX please let me know.  ?

## 2022-03-06 ENCOUNTER — Telehealth: Payer: Self-pay | Admitting: *Deleted

## 2022-03-06 MED ORDER — PEG 3350-KCL-NA BICARB-NACL 420 G PO SOLR: ORAL | 0 refills | Status: DC

## 2022-03-06 NOTE — Telephone Encounter (Signed)
Called pt. He has been scheduled for 4/24 at 11am. Aware will mail prep instructions and send rx to pharmacy ?

## 2022-03-23 DIAGNOSIS — J4540 Moderate persistent asthma, uncomplicated: Secondary | ICD-10-CM | POA: Diagnosis not present

## 2022-03-23 DIAGNOSIS — I10 Essential (primary) hypertension: Secondary | ICD-10-CM | POA: Diagnosis not present

## 2022-04-09 ENCOUNTER — Encounter (HOSPITAL_COMMUNITY)
Admission: RE | Disposition: A | Discharge status: Home / Self Care | Payer: Self-pay | Source: Home / Self Care | Attending: Internal Medicine

## 2022-04-09 ENCOUNTER — Encounter (HOSPITAL_COMMUNITY): Payer: Self-pay | Admitting: Internal Medicine

## 2022-04-09 ENCOUNTER — Other Ambulatory Visit: Payer: Self-pay

## 2022-04-09 ENCOUNTER — Ambulatory Visit (HOSPITAL_COMMUNITY): Payer: BC Managed Care – PPO | Admitting: Anesthesiology

## 2022-04-09 ENCOUNTER — Ambulatory Visit (HOSPITAL_COMMUNITY)
Admission: RE | Admit: 2022-04-09 | Discharge: 2022-04-09 | Disposition: A | Discharge status: Home / Self Care | Payer: BC Managed Care – PPO | Attending: Internal Medicine | Admitting: Internal Medicine

## 2022-04-09 DIAGNOSIS — I209 Angina pectoris, unspecified: Secondary | ICD-10-CM | POA: Diagnosis not present

## 2022-04-09 DIAGNOSIS — D120 Benign neoplasm of cecum: Secondary | ICD-10-CM | POA: Diagnosis not present

## 2022-04-09 DIAGNOSIS — Z79899 Other long term (current) drug therapy: Secondary | ICD-10-CM | POA: Diagnosis not present

## 2022-04-09 DIAGNOSIS — K5730 Diverticulosis of large intestine without perforation or abscess without bleeding: Secondary | ICD-10-CM | POA: Insufficient documentation

## 2022-04-09 DIAGNOSIS — Z87891 Personal history of nicotine dependence: Secondary | ICD-10-CM | POA: Diagnosis not present

## 2022-04-09 DIAGNOSIS — Z1211 Encounter for screening for malignant neoplasm of colon: Secondary | ICD-10-CM

## 2022-04-09 DIAGNOSIS — I10 Essential (primary) hypertension: Secondary | ICD-10-CM | POA: Insufficient documentation

## 2022-04-09 DIAGNOSIS — Z8546 Personal history of malignant neoplasm of prostate: Secondary | ICD-10-CM | POA: Insufficient documentation

## 2022-04-09 DIAGNOSIS — E7800 Pure hypercholesterolemia, unspecified: Secondary | ICD-10-CM | POA: Diagnosis not present

## 2022-04-09 DIAGNOSIS — K635 Polyp of colon: Secondary | ICD-10-CM | POA: Diagnosis not present

## 2022-04-09 HISTORY — PX: COLONOSCOPY WITH PROPOFOL: SHX5780

## 2022-04-09 HISTORY — PX: SUBMUCOSAL LIFTING INJECTION: SHX6855

## 2022-04-09 HISTORY — PX: HOT HEMOSTASIS: SHX5433

## 2022-04-09 HISTORY — PX: POLYPECTOMY: SHX5525

## 2022-04-09 SURGERY — COLONOSCOPY WITH PROPOFOL
Anesthesia: General

## 2022-04-09 MED ORDER — PROPOFOL 10 MG/ML IV BOLUS
INTRAVENOUS | Status: DC | PRN
  Administered 2022-04-09: 100 mg via INTRAVENOUS

## 2022-04-09 MED ORDER — PROPOFOL 10 MG/ML IV BOLUS
INTRAVENOUS | Status: AC
  Filled 2022-04-09: qty 20

## 2022-04-09 MED ORDER — PROPOFOL 1000 MG/100ML IV EMUL
INTRAVENOUS | Status: AC
  Filled 2022-04-09: qty 100

## 2022-04-09 MED ORDER — LACTATED RINGERS IV SOLN: INTRAVENOUS | Status: DC

## 2022-04-09 MED ORDER — PROPOFOL 500 MG/50ML IV EMUL
INTRAVENOUS | Status: DC | PRN
  Administered 2022-04-09: 150 ug/kg/min via INTRAVENOUS

## 2022-04-09 NOTE — Anesthesia Preprocedure Evaluation (Addendum)
Anesthesia Evaluation  ?Patient identified by MRN, date of birth, ID band ?Patient awake ? ? ? ?Reviewed: ?Allergy & Precautions, NPO status , Patient's Chart, lab work & pertinent test results ? ?Airway ?Mallampati: III ? ?TM Distance: >3 FB ?Neck ROM: Full ? ? ?Comment: Neck pain Dental ? ?(+) Dental Advisory Given, Caps ?Crowns :   ?Pulmonary ?shortness of breath and with exertion, asthma , former smoker,  ?  ?Pulmonary exam normal ?breath sounds clear to auscultation ? ? ? ? ? ? Cardiovascular ?Exercise Tolerance: Good ?hypertension, Pt. on medications ?+ angina Normal cardiovascular exam ?Rhythm:Regular Rate:Normal ? ? ?  ?Neuro/Psych ? Neuromuscular disease negative psych ROS  ? GI/Hepatic ?negative GI ROS, Neg liver ROS,   ?Endo/Other  ?negative endocrine ROS ? Renal/GU ?Renal disease  ? ?Prostate cancer  ? ?  ?Musculoskeletal ?negative musculoskeletal ROS ?(+)  ? Abdominal ?  ?Peds ? Hematology ?negative hematology ROS ?(+)   ?Anesthesia Other Findings ? ? Reproductive/Obstetrics ?negative OB ROS ? ?  ? ? ? ? ? ? ? ? ? ? ? ? ? ?  ?  ? ? ? ? ? ? ? ?Anesthesia Physical ?Anesthesia Plan ? ?ASA: 3 ? ?Anesthesia Plan: General  ? ?Post-op Pain Management: Minimal or no pain anticipated  ? ?Induction: Intravenous ? ?PONV Risk Score and Plan: Propofol infusion ? ?Airway Management Planned: Nasal Cannula and Natural Airway ? ?Additional Equipment:  ? ?Intra-op Plan:  ? ?Post-operative Plan:  ? ?Informed Consent: I have reviewed the patients History and Physical, chart, labs and discussed the procedure including the risks, benefits and alternatives for the proposed anesthesia with the patient or authorized representative who has indicated his/her understanding and acceptance.  ? ? ? ?Dental advisory given ? ?Plan Discussed with: CRNA and Surgeon ? ?Anesthesia Plan Comments:   ? ? ? ? ? ?Anesthesia Quick Evaluation ? ?

## 2022-04-09 NOTE — Discharge Instructions (Addendum)
?  Colonoscopy ?Discharge Instructions ? ?Read the instructions outlined below and refer to this sheet in the next few weeks. These discharge instructions provide you with general information on caring for yourself after you leave the hospital. Your doctor may also give you specific instructions. While your treatment has been planned according to the most current medical practices available, unavoidable complications occasionally occur. If you have any problems or questions after discharge, call Dr. Gala Romney at [phone]. ?ACTIVITY ?You may resume your regular activity, but move at a slower pace for the next 24 hours.  ?Take frequent rest periods for the next 24 hours.  ?Walking will help get rid of the air and reduce the bloated feeling in your belly (abdomen).  ?No driving for 24 hours (because of the medicine (anesthesia) used during the test).   ?Do not sign any important legal documents or operate any machinery for 24 hours (because of the anesthesia used during the test).  ?NUTRITION ?Drink plenty of fluids.  ?You may resume your normal diet as instructed by your doctor.  ?Begin with a light meal and progress to your normal diet. Heavy or fried foods are harder to digest and may make you feel sick to your stomach (nauseated).  ?Avoid alcoholic beverages for 24 hours or as instructed.  ?MEDICATIONS ?You may resume your normal medications unless your doctor tells you otherwise.  ?WHAT YOU CAN EXPECT TODAY ?Some feelings of bloating in the abdomen.  ?Passage of more gas than usual.  ?Spotting of blood in your stool or on the toilet paper.  ?IF YOU HAD POLYPS REMOVED DURING THE COLONOSCOPY: ?No aspirin products for 7 days or as instructed.  ?No alcohol for 7 days or as instructed.  ?Eat a soft diet for the next 24 hours.  ?FINDING OUT THE RESULTS OF YOUR TEST ?Not all test results are available during your visit. If your test results are not back during the visit, make an appointment with your caregiver to find out the  results. Do not assume everything is normal if you have not heard from your caregiver or the medical facility. It is important for you to follow up on all of your test results.  ?SEEK IMMEDIATE MEDICAL ATTENTION IF: ?You have more than a spotting of blood in your stool.  ?Your belly is swollen (abdominal distention).  ?You are nauseated or vomiting.  ?You have a temperature over 101.  ?You have abdominal pain or discomfort that is severe or gets worse throughout the day.   ? ?1 polyp removed from your colon today ? ?Diverticulosis and polyp information provided ?   ?      At patient request, I called [place] at [phone] -reviewed findings. ? ?       Further recommendations to follow pending review of pathology report ?

## 2022-04-09 NOTE — H&P (Signed)
?$'@LOGO'P$ @ ? ? ?Primary Care Physician:  Asencion Noble, MD ?Primary Gastroenterologist:  Dr. Gala Romney ? ?Pre-Procedure History & Physical: ?HPI:  [name] [name] is a 61 y.o. male here for screening colonoscopy.  No GI symptoms aside from anorectal pain 2 months ago which subsided.  Not having any bleeding.  2011 colonoscopy demonstrated 2 small hyperplastic polyps-removed. ? ?Past Medical History:  ?Diagnosis Date  ? Childhood asthma   ? Diverticulitis   ? History of kidney stones   ? Hypercholesteremia   ? Hypertension   ? Prostate cancer ([place])   ? ? ?Past Surgical History:  ?Procedure Laterality Date  ? EXTRACORPOREAL SHOCK WAVE LITHOTRIPSY Left 03/14/2021  ? Procedure: EXTRACORPOREAL SHOCK WAVE LITHOTRIPSY (ESWL);  Surgeon: Cleon Gustin, MD;  Location: [facility];  Service: Urology;  Laterality: Left;  ? LEFT HEART CATH AND CORONARY ANGIOGRAPHY N/A 09/20/2021  ? Procedure: LEFT HEART CATH AND CORONARY ANGIOGRAPHY;  Surgeon: Sherren Mocha, MD;  Location: [facility];  Service: Cardiovascular;  Laterality: N/A;  ? NECK SURGERY    ? ROBOT ASSISTED LAPAROSCOPIC RADICAL PROSTATECTOMY  2009  ? SPINE SURGERY    ? ? ?Prior to Admission medications   ?Medication Sig Start Date End Date Taking? Authorizing Provider  ?acetaminophen (TYLENOL) 500 MG tablet Take 1,000 mg by mouth every 8 (eight) hours as needed for moderate pain.   Yes [provider]  ?albuterol (VENTOLIN HFA) 108 (90 Base) MCG/ACT inhaler Inhale 2 puffs into the lungs every 4 (four) hours as needed for wheezing or shortness of breath. 10/26/21  Yes [provider]  ?atorvastatin (LIPITOR) 20 MG tablet Take 1 tablet (20 mg total) by mouth daily. 09/20/21 09/20/22 Yes Cheryln Manly, NP  ?fluticasone (FLONASE) 50 MCG/ACT nasal spray Place 1-2 sprays into both nostrils daily as needed for allergies.   Yes [provider]  ?olmesartan (BENICAR) 40 MG tablet Take 40 mg by mouth daily. 12/25/21  Yes [provider]   ?benzonatate (TESSALON) 100 MG capsule Take 1-2 capsules (100-200 mg total) by mouth 3 (three) times daily as needed for cough. ?Patient not taking: Reported on 04/03/2022 03/03/22   Jaynee Eagles, PA-C  ?levocetirizine (XYZAL) 5 MG tablet Take 1 tablet (5 mg total) by mouth every evening. ?Patient not taking: Reported on 04/03/2022 03/03/22   Jaynee Eagles, PA-C  ?polyethylene glycol-electrolytes (NULYTELY) 420 g solution As directed 03/06/22   Milagro Belmares, Cristopher Estimable, MD  ? ? ?Allergies as of 03/06/2022 - Review Complete 03/05/2022  ?Allergen Reaction Noted  ? Celecoxib    ? Penicillins    ? Sulfonamide derivatives    ? ? ?Family History  ?Problem Relation Age of Onset  ? Hypertension Father   ? ? ?Social History  ? ?Socioeconomic History  ? Marital status: Married  ?  Spouse name: Not on file  ? Number of children: 1  ? Years of education: Not on file  ? Highest education level: Not on file  ?Occupational History  ? Occupation: Careers information officer  ?Tobacco Use  ? Smoking status: Former  ?  Packs/day: 2.00  ?  Years: 40.00  ?  Pack years: 80.00  ?  Types: Cigarettes  ?  Quit date: 12/03/1979  ?  Years since quitting: 42.3  ? Smokeless tobacco: Never  ?Vaping Use  ? Vaping Use: Never used  ?Substance and Sexual Activity  ? Alcohol use: Yes  ?  Alcohol/week: 4.0 standard drinks  ?  Types: 4 Cans of beer per week  ?  Comment: couple drinks per week  ? Drug use: Not Currently  ? Sexual activity: Yes  ?Other Topics Concern  ? Not on file  ?Social History Narrative  ? Not on file  ? ?Social Determinants of Health  ? ?Financial Resource Strain: Not on file  ?Food Insecurity: Not on file  ?Transportation Needs: Not on file  ?Physical Activity: Not on file  ?Stress: Not on file  ?Social Connections: Not on file  ?Intimate Partner Violence: Not on file  ? ? ?Review of Systems: ?See HPI, otherwise negative ROS ? ?Physical Exam: ?BP (!) 158/91   Temp 98.4 ?F (36.9 ?C) (Oral)   Resp 19   Ht 6' (1.829 m)   Wt 115.7 kg   SpO2 98%    BMI 34.58 kg/m?  ?General:   Alert,  Well-developed, well-nourished, pleasant and cooperative in NAD ?Mouth:  No deformity or lesions. ?Neck:  Supple; no masses or thyromegaly. No significant cervical adenopathy. ?Lungs:  Clear throughout to auscultation.   No wheezes, crackles, or rhonchi. No acute distress. ?Heart:  Regular rate and rhythm; no murmurs, clicks, rubs,  or gallops. ?Abdomen: Non-distended, normal bowel sounds.  Soft and nontender without appreciable mass or hepatosplenomegaly.  ?Pulses:  Normal pulses noted. ?Extremities:  Without clubbing or edema. ? ?Impression/Plan: 61 year-old gentleman here for average risk screening colonoscopy ? ?I have offered the patient an average risk screening colonoscopy today.  The risks, benefits, limitations, alternatives and imponderables have been reviewed with the patient. Questions have been answered. All parties are agreeable.   ? ? ? ? ?Notice: This dictation was prepared with Dragon dictation along with smaller phrase technology. Any transcriptional errors that result from this process are unintentional and may not be corrected upon review.  ?

## 2022-04-09 NOTE — Transfer of Care (Signed)
Immediate Anesthesia Transfer of Care Note ? ?Patient: [name] [name] ? ?Procedure(s) Performed: COLONOSCOPY WITH PROPOFOL ?POLYPECTOMY ?HOT HEMOSTASIS (ARGON PLASMA COAGULATION/BICAP) ?SUBMUCOSAL LIFTING INJECTION ? ?Patient Location: PACU ? ?Anesthesia Type:General ? ?Level of Consciousness: awake, alert , oriented and patient cooperative ? ?Airway & Oxygen Therapy: Patient Spontanous Breathing ? ?Post-op Assessment: Report given to RN, Post -op Vital signs reviewed and stable and Patient moving all extremities X 4 ? ?Post vital signs: Reviewed and stable ? ?Last Vitals:  ?Vitals Value Taken Time  ?BP    ?Temp    ?Pulse    ?Resp    ?SpO2    ? ? ?Last Pain:  ?Vitals:  ? 04/09/22 1101  ?TempSrc:   ?PainSc: 0-No pain  ?   ? ?Patients Stated Pain Goal: 7 (04/09/22 0951) ? ?Complications: No notable events documented. ?

## 2022-04-09 NOTE — Anesthesia Postprocedure Evaluation (Signed)
Anesthesia Post Note ? ?Patient: [name] [name] ? ?Procedure(s) Performed: COLONOSCOPY WITH PROPOFOL ?POLYPECTOMY ?HOT HEMOSTASIS (ARGON PLASMA COAGULATION/BICAP) ?SUBMUCOSAL LIFTING INJECTION ? ?Patient location during evaluation: Endoscopy ?Anesthesia Type: General ?Level of consciousness: awake and alert and oriented ?Pain management: pain level controlled ?Vital Signs Assessment: post-procedure vital signs reviewed and stable ?Respiratory status: spontaneous breathing, nonlabored ventilation and respiratory function stable ?Cardiovascular status: blood pressure returned to baseline and stable ?Postop Assessment: no apparent nausea or vomiting ?Anesthetic complications: no ? ? ?No notable events documented. ? ? ?Last Vitals:  ?Vitals:  ? 04/09/22 0951 04/09/22 1130  ?BP: (!) 158/91 112/79  ?Pulse:  70  ?Resp: 19 19  ?Temp: 36.9 ?C 36.8 ?C  ?SpO2: 98% 98%  ?  ?Last Pain:  ?Vitals:  ? 04/09/22 1130  ?TempSrc: Oral  ?PainSc: 0-No pain  ? ? ?  ?  ?  ?  ?  ?  ? ?Jeanette Rauth C Paralee Pendergrass ? ? ? ? ?

## 2022-04-09 NOTE — Op Note (Signed)
[facility] ?Patient Name: [name] [name] ?Procedure Date: 04/09/2022 11:28 AM ?MRN: [id number] ?Date of Birth: 1961-08-16 ?Attending MD: Norvel Richards , MD ?CSN: [id number] ?Age: 62 ?Admit Type: Inpatient ?Procedure:                Colonoscopy ?Indications:              Screening for colorectal malignant neoplasm ?Providers:                Norvel Richards, MD ?Referring MD:              ?Medicines:                Propofol per Anesthesia ?Complications:            No immediate complications. ?Estimated Blood Loss:     Estimated blood loss: none. ?Procedure:                Pre-Anesthesia Assessment: ?                          - Prior to the procedure, a History and Physical  ?                          was performed, and patient medications and  ?                          allergies were reviewed. The patient's tolerance of  ?                          previous anesthesia was also reviewed. The risks  ?                          and benefits of the procedure and the sedation  ?                          options and risks were discussed with the patient.  ?                          All questions were answered, and informed consent  ?                          was obtained. Prior Anticoagulants: The patient has  ?                          taken no previous anticoagulant or antiplatelet  ?                          agents. ASA Grade Assessment: II - A patient with  ?                          mild systemic disease. After reviewing the risks  ?                          and benefits, the patient was deemed in  ?  satisfactory condition to undergo the procedure. ?                          After obtaining informed consent, the colonoscope  ?                          was passed under direct vision. Throughout the  ?                          procedure, the patient's blood pressure, pulse, and  ?                          oxygen saturations were monitored continuously. The  ?                           [id number]) scope was introduced through the  ?                          anus and advanced to the the cecum, identified by  ?                          appendiceal orifice and ileocecal valve. The  ?                          colonoscopy was performed without difficulty. The  ?                          patient tolerated the procedure well. The quality  ?                          of the bowel preparation was adequate. ?Scope In: ?Scope Out: ?Findings: ?     The perianal and digital rectal examinations were normal. ?     Scattered medium-mouthed diverticula were found in the sigmoid colon and  ?     descending colon. ?     A 15 x 6 mm polyp was found in the cecum. The polyp was  ?     semi-pedunculated. Lifted nicely with 3 cc of Ella view. The polyp was  ?     removed with a hot snare. Resection and retrieval were complete with 1  ?     pass of the snare. Estimated blood loss: none. ?     The exam was otherwise without abnormality on direct and retroflexion  ?     views. ?Impression:               - Diverticulosis in the sigmoid colon and in the  ?                          descending colon. ?                          - One 15 mm polyp in the cecum, removed with a hot  ?                          snare. Resected and retrieved. ?                          -  The examination was otherwise normal on direct  ?                          and retroflexion views. ?Moderate Sedation: ?     Moderate (conscious) sedation was personally administered by an  ?     anesthesia professional. The following parameters were monitored: oxygen  ?     saturation, heart rate, blood pressure, respiratory rate, EKG, adequacy  ?     of pulmonary ventilation, and response to care. ?Recommendation:           - Patient has a contact number available for  ?                          emergencies. The signs and symptoms of potential  ?                          delayed complications were discussed with the  ?                          patient.  Return to normal activities tomorrow.  ?                          Written discharge instructions were provided to the  ?                          patient. ?                          - Resume previous diet. ?                          - Continue present medications. ?                          - Repeat colonoscopy date to be determined after  ?                          pending pathology results are reviewed for  ?                          surveillance based on pathology results. ?                          - Return to GI office (date not yet determined). ?Procedure Code(s):        --- Professional --- ?                          [phone], Colonoscopy, flexible; with removal of  ?                          tumor(s), polyp(s), or other lesion(s) by snare  ?                          technique ?Diagnosis Code(s):        --- Professional --- ?  Z12.11, Encounter for screening for malignant  ?                          neoplasm of colon ?                          K63.5, Polyp of colon ?                          K57.30, Diverticulosis of large intestine without  ?                          perforation or abscess without bleeding ?CPT copyright 2019 [employer]. All rights reserved. ?The codes documented in this report are preliminary and upon coder review may  ?be revised to meet current compliance requirements. ?Cristopher Estimable. Malanie Koloski, MD ?Norvel Richards, MD ?04/09/2022 12:30:40 PM ?This report has been signed electronically. ?Number of Addenda: 0 ?

## 2022-04-10 ENCOUNTER — Encounter: Payer: Self-pay | Admitting: Internal Medicine

## 2022-04-10 LAB — SURGICAL PATHOLOGY

## 2022-04-12 ENCOUNTER — Encounter (HOSPITAL_COMMUNITY): Payer: Self-pay | Admitting: Internal Medicine

## 2022-04-12 ENCOUNTER — Ambulatory Visit: Payer: BC Managed Care – PPO | Admitting: Internal Medicine

## 2022-05-18 ENCOUNTER — Ambulatory Visit: Payer: BC Managed Care – PPO | Admitting: Gastroenterology

## 2022-07-10 DIAGNOSIS — J4540 Moderate persistent asthma, uncomplicated: Secondary | ICD-10-CM | POA: Diagnosis not present

## 2022-07-10 DIAGNOSIS — I10 Essential (primary) hypertension: Secondary | ICD-10-CM | POA: Diagnosis not present

## 2022-07-10 DIAGNOSIS — Z23 Encounter for immunization: Secondary | ICD-10-CM | POA: Diagnosis not present

## 2022-07-23 IMAGING — DX DG CHEST 2V
2 series · 2 of 2 positions shown · non-contrast
Comparison: 09/19/2021

CLINICAL DATA: Cough, right chest pain

EXAM:
CHEST - 2 VIEW

[chest pa]
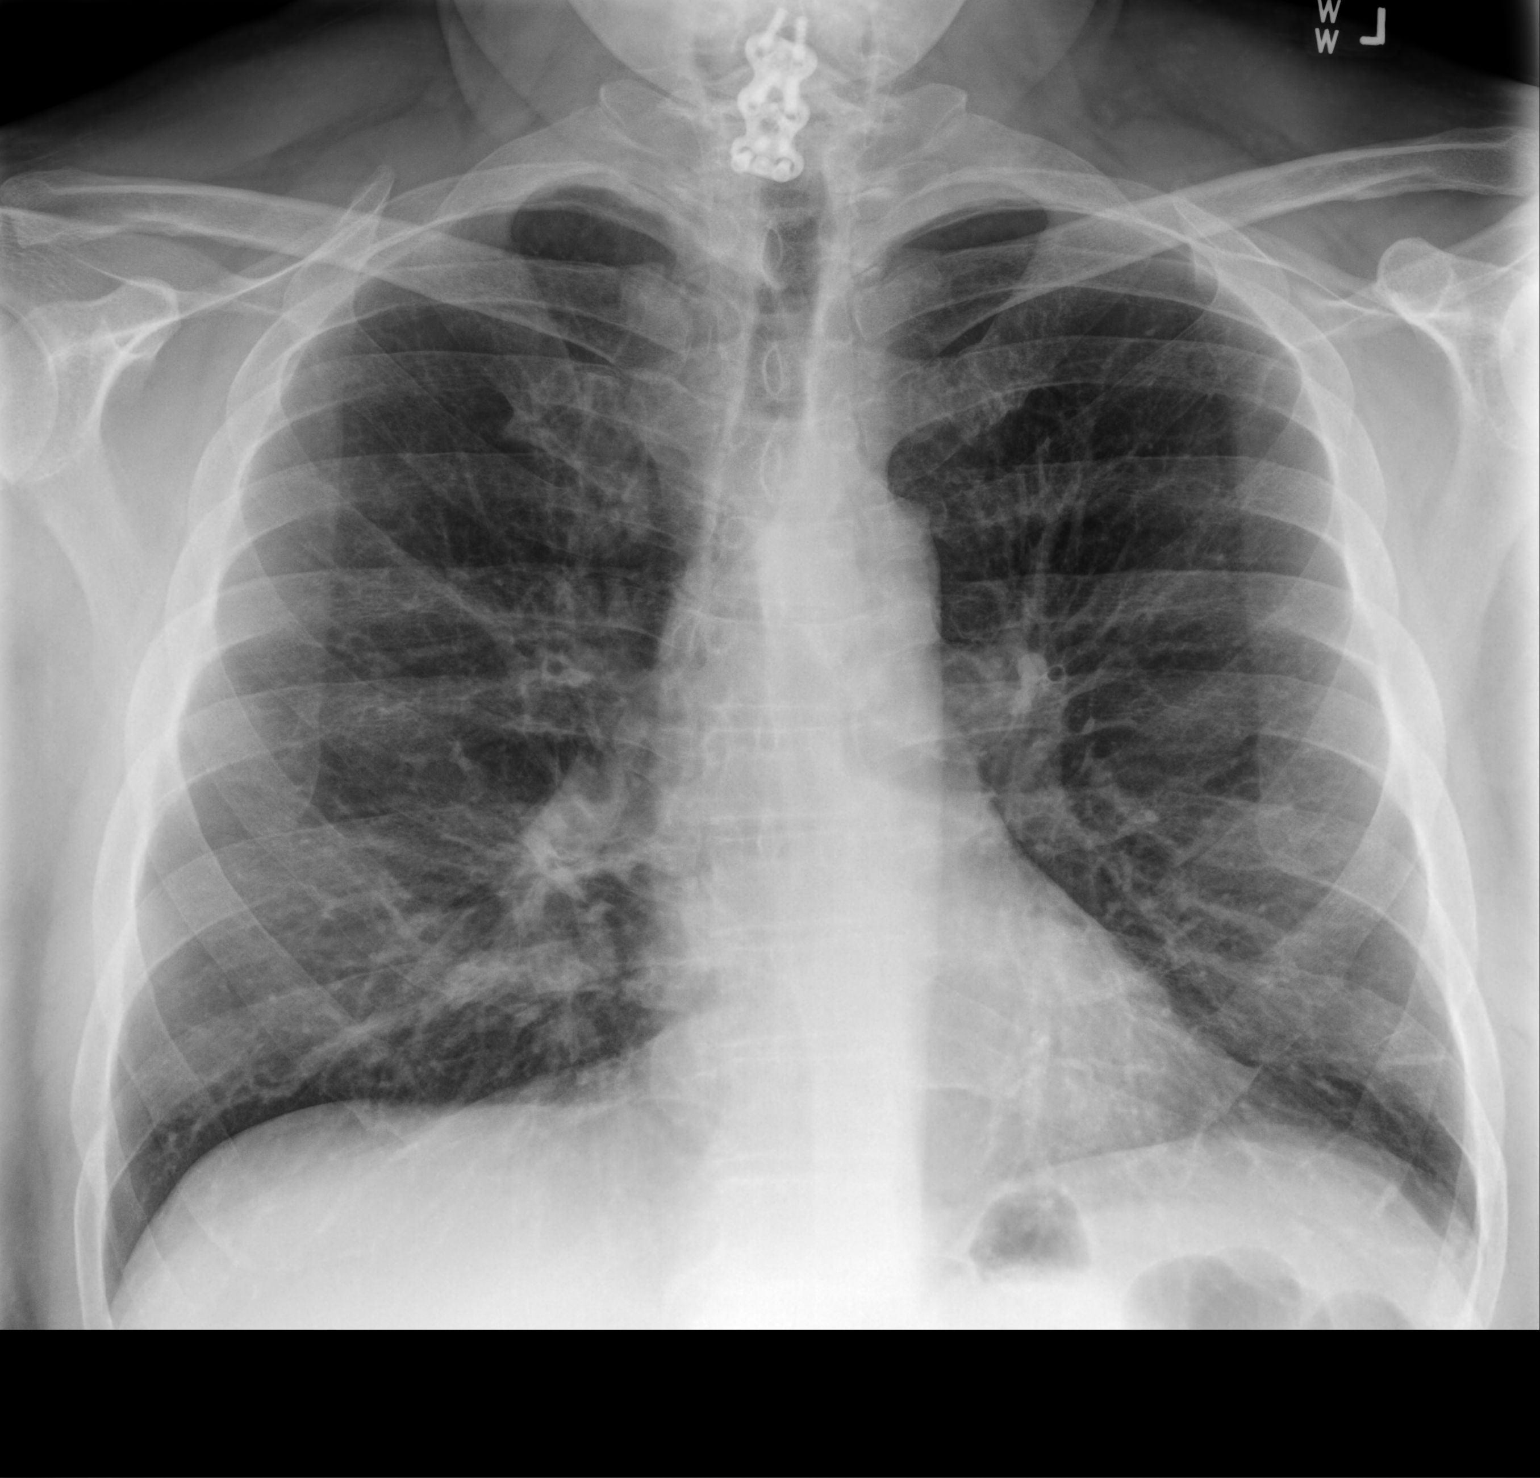

[chest lat]
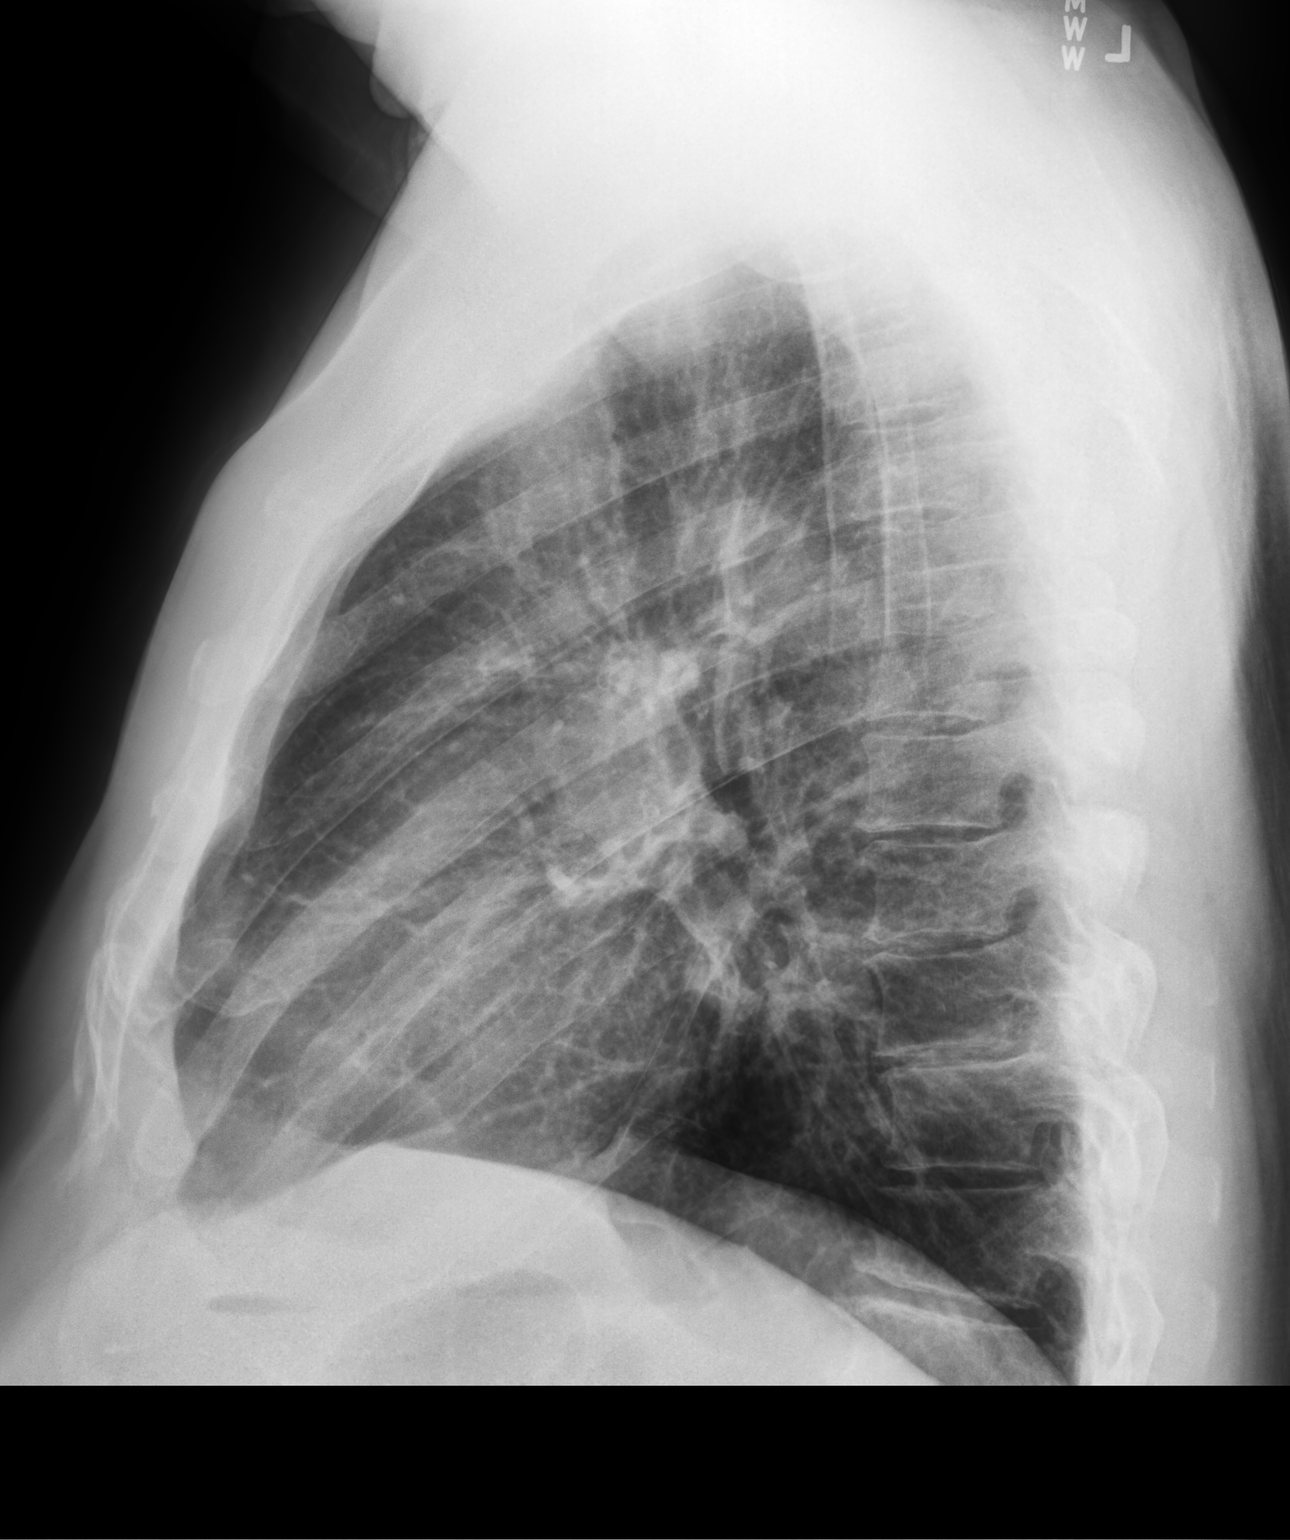

[2 of 2 positions shown; findings below may reference images not displayed]

FINDINGS: Cardiac size is within normal limits. There are no signs of
pulmonary edema or focal pulmonary consolidation. Small transverse
linear density in the lateral aspect of left lower lung fields has
not changed suggesting minimal scarring. There is previous surgical
fusion in the lower cervical spine.
IMPRESSION: No active cardiopulmonary disease.

## 2022-09-28 ENCOUNTER — Other Ambulatory Visit: Payer: Self-pay | Admitting: Cardiology

## 2022-10-31 DIAGNOSIS — Z23 Encounter for immunization: Secondary | ICD-10-CM | POA: Diagnosis not present

## 2022-11-26 ENCOUNTER — Ambulatory Visit: Payer: BC Managed Care – PPO | Admitting: Urology

## 2022-11-26 ENCOUNTER — Ambulatory Visit (HOSPITAL_COMMUNITY)
Admission: RE | Admit: 2022-11-26 | Discharge: 2022-11-26 | Disposition: A | Discharge status: Home / Self Care | Payer: BC Managed Care – PPO | Source: Ambulatory Visit | Attending: Urology | Admitting: Urology

## 2022-11-26 ENCOUNTER — Encounter: Payer: Self-pay | Admitting: Urology

## 2022-11-26 VITALS — BP 128/70 | HR 82

## 2022-11-26 DIAGNOSIS — N200 Calculus of kidney: Secondary | ICD-10-CM

## 2022-11-26 DIAGNOSIS — N201 Calculus of ureter: Secondary | ICD-10-CM | POA: Diagnosis not present

## 2022-11-26 DIAGNOSIS — R109 Unspecified abdominal pain: Secondary | ICD-10-CM

## 2022-11-26 LAB — URINALYSIS, ROUTINE W REFLEX MICROSCOPIC
Bilirubin, UA: NEGATIVE
Glucose, UA: NEGATIVE
Ketones, UA: NEGATIVE
Leukocytes,UA: NEGATIVE
Nitrite, UA: NEGATIVE
Protein,UA: NEGATIVE
RBC, UA: NEGATIVE
Specific Gravity, UA: 1.01 (ref 1.005–1.030)
Urobilinogen, Ur: 0.2 mg/dL (ref 0.2–1.0)
pH, UA: 5.5 (ref 5.0–7.5)

## 2022-11-26 MED ORDER — TAMSULOSIN HCL 0.4 MG PO CAPS: 0.4000 mg | ORAL_CAPSULE | Freq: Every day | ORAL | 0 refills | Status: DC

## 2022-11-26 NOTE — Patient Instructions (Signed)

## 2022-11-26 NOTE — Progress Notes (Signed)
11/26/2022 10:33 AM   [name] [name] 26-Dec-1960 [id number]  Referring provider: Asencion Noble, MD [address] [place],  [place] [address]  Right flank pain   HPI: Mr [name] [name] is a 61yo here for followup for nephrolithiasis. Starting 5 days ago he developed right flank pain that is sharp, intermittent, mild to moderate and radiates from his right flank to his groin. No significant LUTS. No hematuria. No nausea/vomiting. KUB today shows 31m right distal ureteral calculus   PMH: Past Medical History:  Diagnosis Date   Childhood asthma    Diverticulitis    History of kidney stones    Hypercholesteremia    Hypertension    Prostate cancer ([facility]     Surgical History: Past Surgical History:  Procedure Laterality Date   COLONOSCOPY WITH PROPOFOL N/A 04/09/2022   Procedure: COLONOSCOPY WITH PROPOFOL;  Surgeon: RDaneil Dolin MD;  [facility];  Service: Endoscopy;  Laterality: N/A;  11:00am   EXTRACORPOREAL SHOCK WAVE LITHOTRIPSY Left 03/14/2021   Procedure: EXTRACORPOREAL SHOCK WAVE LITHOTRIPSY (ESWL);  Surgeon: MCleon Gustin MD;  Location: [facility];  Service: Urology;  Laterality: Left;   HOT HEMOSTASIS  04/09/2022   Procedure: HOT HEMOSTASIS (ARGON PLASMA COAGULATION/BICAP);  Surgeon: RDaneil Dolin MD;  [facility];  Service: Endoscopy;;   LEFT HEART CATH AND CORONARY ANGIOGRAPHY N/A 09/20/2021   Procedure: LEFT HEART CATH AND CORONARY ANGIOGRAPHY;  Surgeon: CSherren Mocha MD;  [facility];  Service: Cardiovascular;  Laterality: N/A;   NECK SURGERY     POLYPECTOMY  04/09/2022   Procedure: POLYPECTOMY;  Surgeon: RDaneil Dolin MD;  [facility];  Service: Endoscopy;;   ROBOT ASSISTED LAPAROSCOPIC RADICAL PROSTATECTOMY  2009   SPINE SURGERY     SUBMUCOSAL LIFTING INJECTION  04/09/2022   Procedure: SUBMUCOSAL LIFTING INJECTION;  Surgeon: RDaneil Dolin MD;  [facility];  Service: Endoscopy;;    Home  Medications:  Allergies as of 11/26/2022       Reactions   Penicillins    Childhood allergy   Sulfonamide Derivatives    Pt does not remember reaction    Celebrex [celecoxib] Rash        Medication List        Accurate as of November 26, 2022 10:33 AM. If you have any questions, ask your nurse or doctor.          acetaminophen 500 MG tablet Commonly known as: TYLENOL Take 1,000 mg by mouth every 8 (eight) hours as needed for moderate pain.   albuterol 108 (90 Base) MCG/ACT inhaler Commonly known as: VENTOLIN HFA Inhale 2 puffs into the lungs every 4 (four) hours as needed for wheezing or shortness of breath.   atorvastatin 20 MG tablet Commonly known as: Lipitor Take 1 tablet (20 mg total) by mouth daily.   benzonatate 100 MG capsule Commonly known as: TESSALON Take 1-2 capsules (100-200 mg total) by mouth 3 (three) times daily as needed for cough.   fluticasone 50 MCG/ACT nasal spray Commonly known as: FLONASE Place 1-2 sprays into both nostrils daily as needed for allergies.   levocetirizine 5 MG tablet Commonly known as: XYZAL Take 1 tablet (5 mg total) by mouth every evening.   olmesartan 40 MG tablet Commonly known as: BENICAR Take 40 mg by mouth daily.   polyethylene glycol-electrolytes 420 g solution Commonly known as: NuLYTELY As directed        Allergies:  Allergies  Allergen  Reactions   Penicillins     Childhood allergy   Sulfonamide Derivatives     Pt does not remember reaction    Celebrex [Celecoxib] Rash    Family History: Family History  Problem Relation Age of Onset   Hypertension Father     Social History:  reports that he quit smoking about 43 years ago. His smoking use included cigarettes. He has a 80.00 pack-year smoking history. He has never used smokeless tobacco. He reports current alcohol use of about 4.0 standard drinks of alcohol per week. He reports that he does not currently use drugs.  ROS: All other review of  systems were reviewed and are negative except what is noted above in HPI  Physical Exam: BP 128/70   Pulse 82   Constitutional:  Alert and oriented, No acute distress. HEENT: [place] AT, moist mucus membranes.  Trachea midline, no masses. Cardiovascular: No clubbing, cyanosis, or edema. Respiratory: Normal respiratory effort, no increased work of breathing. GI: Abdomen is soft, nontender, nondistended, no abdominal masses GU: No CVA tenderness.  Lymph: No cervical or inguinal lymphadenopathy. Skin: No rashes, bruises or suspicious lesions. Neurologic: Grossly intact, no focal deficits, moving all 4 extremities. Psychiatric: Normal mood and affect.  Laboratory Data: Lab Results  Component Value Date   WBC 6.4 09/20/2021   HGB 14.9 09/20/2021   HCT 45.1 09/20/2021   MCV 89.1 09/20/2021   PLT 180 09/20/2021    Lab Results  Component Value Date   CREATININE 1.13 09/20/2021    No results found for: "PSA"  No results found for: "TESTOSTERONE"  No results found for: "HGBA1C"  Urinalysis    Component Value Date/Time   COLORURINE YELLOW 02/28/2021 0631   APPEARANCEUR Clear 03/20/2021 0933   LABSPEC 1.023 02/28/2021 0631   PHURINE 5.0 02/28/2021 0631   GLUCOSEU Negative 03/20/2021 0933   HGBUR NEGATIVE 02/28/2021 0631   BILIRUBINUR Negative 03/20/2021 0933   KETONESUR 5 (A) 02/28/2021 0631   PROTEINUR Negative 03/20/2021 0933   PROTEINUR NEGATIVE 02/28/2021 0631   UROBILINOGEN 0.2 05/28/2011 0746   NITRITE Negative 03/20/2021 0933   NITRITE NEGATIVE 02/28/2021 0631   LEUKOCYTESUR Negative 03/20/2021 0933   LEUKOCYTESUR TRACE (A) 02/28/2021 0631    Lab Results  Component Value Date   LABMICR See below: 03/20/2021   WBCUA None seen 03/20/2021   LABEPIT None seen 03/20/2021   MUCUS Present 03/01/2021   BACTERIA None seen 03/20/2021    Pertinent Imaging: KUB 11/26/2022: Images reviewed and discussed with the patient  Results for orders placed in visit on  04/12/21  Abdomen 1 view (KUB)  Narrative CLINICAL DATA:  Nephrolithiasis  EXAM: ABDOMEN - 1 VIEW  COMPARISON:  March 01, 2021 abdominal radiograph and CT abdomen and pelvis February 28, 2021  FINDINGS: Previous calculus on the left not evident currently. Apparent phleboliths present in the pelvis, unchanged. There is moderate stool in the colon. There is no bowel dilatation or air-fluid level to suggest bowel obstruction. No free air on supine examination. Visualized lung bases clear.  IMPRESSION: Previous calculus on the left not appreciable currently. Apparent phleboliths in the pelvis. No new calcifications evident. No bowel obstruction or free air evident on supine examination.   Electronically Signed By: Lowella Grip III M.D. On: 04/13/2021 14:30  No results found for this or any previous visit.  No results found for this or any previous visit.  No results found for this or any previous visit.  No results found for this or any previous  visit.  No valid procedures specified. No results found for this or any previous visit.  Results for orders placed during the hospital encounter of 02/28/21  CT Renal Stone Study  Narrative CLINICAL DATA:  Flank pain.  Kidney stone suspected.  EXAM: CT ABDOMEN AND PELVIS WITHOUT CONTRAST  TECHNIQUE: Multidetector CT imaging of the abdomen and pelvis was performed following the standard protocol without IV contrast.  COMPARISON:  CT angio chest 04/10/2019, CT abdomen pelvis 05/28/2011  FINDINGS: Lower chest: No acute abnormality.  Hepatobiliary: No focal liver abnormality. No gallstones, gallbladder wall thickening, or pericholecystic fluid. No biliary dilatation.  Pancreas: No focal lesion. Normal pancreatic contour. No surrounding inflammatory changes. No main pancreatic ductal dilatation.  Spleen: Normal in size without focal abnormality.  Adrenals/Urinary Tract: No adrenal nodule bilaterally. 1-2  mm calcified stones within the right kidney. 1-2 mm calcified stone within the left kidney. There is a 7 mm calcified stone at the left ureteropelvic junction with associated proximal mild hydronephrosis. No right ureterolithiasis. No right hydronephrosis. No contour-deforming renal mass. No ureterolithiasis or hydroureter. The urinary bladder is decompressed with trace perivesicular fat stranding.  Stomach/Bowel: Stomach is within normal limits. No evidence of bowel wall thickening or dilatation. Appendix appears normal.  Vascular/Lymphatic: No abdominal aorta or iliac aneurysm. Mild atherosclerotic plaque. Prominent but nonenlarged abdomen lymph nodes. No abdominal, pelvic, or inguinal lymphadenopathy.  Reproductive: The prostate is not definitely identified.  Other: Trace free simple pelvic fluid. No intraperitoneal free gas. No organized fluid collection.  Musculoskeletal:  No abdominal wall hernia or abnormality.  No suspicious lytic or blastic osseous lesions. No acute displaced fracture.  IMPRESSION: 1. Obstructive 7 mm left ureteropelvic junction stone. Correlate with urinalysis for superimposed infection. 2. Nonobstructive bilateral 1-2 mm nephrolithiasis.   Electronically Signed By: Iven Finn M.D. On: 02/28/2021 07:03   Assessment & Plan:    1. nephrolithiasis -continue flomax 0.'4mg'$  daily -followup 2 weeks with KUB - Urinalysis, Routine w reflex microscopic   No follow-ups on file.  Nicolette Bang, MD  [facility] [place]

## 2022-12-07 ENCOUNTER — Ambulatory Visit: Payer: BC Managed Care – PPO | Admitting: Urology

## 2023-04-09 ENCOUNTER — Ambulatory Visit: Payer: BC Managed Care – PPO | Admitting: Urology

## 2023-04-09 VITALS — BP 128/70 | HR 65

## 2023-04-09 DIAGNOSIS — N200 Calculus of kidney: Secondary | ICD-10-CM | POA: Diagnosis not present

## 2023-04-09 DIAGNOSIS — C61 Malignant neoplasm of prostate: Secondary | ICD-10-CM

## 2023-04-09 LAB — URINALYSIS, ROUTINE W REFLEX MICROSCOPIC
Bilirubin, UA: NEGATIVE
Glucose, UA: NEGATIVE
Ketones, UA: NEGATIVE
Leukocytes,UA: NEGATIVE
Nitrite, UA: NEGATIVE
Protein,UA: NEGATIVE
RBC, UA: NEGATIVE
Specific Gravity, UA: 1.015 (ref 1.005–1.030)
Urobilinogen, Ur: 0.2 mg/dL (ref 0.2–1.0)
pH, UA: 7 (ref 5.0–7.5)

## 2023-04-09 NOTE — Progress Notes (Signed)
04/09/2023 1:38 PM   [name] [name] 21-Aug-1961 [id number]  Referring provider: Carylon Perches, MD [address] [place],  [place] [address]  Prostate cancer   HPI: [name] is a 62yo here for followup for prostate cancer. His last PSA 3 years ago was undetectable. He has mild SUI. No stone events since last visit. He has rare intermittent right flank pain. No worsening LUTS. No other complaints today  His prior records are as follows:  T2b, gleason 7 prostate cancer treated with prostatectomy in 2009. His PSA remains undetectible.   He ED and is managed with sildenafil.  He has had some SUI but it is rare.  He doesn't require protection.  His IPSS is 2 with nocturia x 2.  He is doing well but he has some increased mylagias and joint pain that requires ibuprofen.   He has a history of stones but on worrisome symptoms.   His UA today has 3-10 RBC.  He is a former smoker with an 13 pk year history but he quit 40+ years ago.    PMH: Past Medical History:  Diagnosis Date   Childhood asthma    Diverticulitis    History of kidney stones    Hypercholesteremia    Hypertension    Prostate cancer [facility])     Surgical History: Past Surgical History:  Procedure Laterality Date   COLONOSCOPY WITH PROPOFOL N/A 04/09/2022   Procedure: COLONOSCOPY WITH PROPOFOL;  Surgeon: Corbin Ade, MD;  [facility];  Service: Endoscopy;  Laterality: N/A;  11:00am   EXTRACORPOREAL SHOCK WAVE LITHOTRIPSY Left 03/14/2021   Procedure: EXTRACORPOREAL SHOCK WAVE LITHOTRIPSY (ESWL);  Surgeon: Malen Gauze, MD;  Location: [facility];  Service: Urology;  Laterality: Left;   HOT HEMOSTASIS  04/09/2022   Procedure: HOT HEMOSTASIS (ARGON PLASMA COAGULATION/BICAP);  Surgeon: Corbin Ade, MD;  [facility];  Service: Endoscopy;;   LEFT HEART CATH AND CORONARY ANGIOGRAPHY N/A 09/20/2021   Procedure: LEFT HEART CATH AND CORONARY ANGIOGRAPHY;  Surgeon: Tonny Bollman, MD;  Location: [facility]  INVASIVE CV LAB;  Service: Cardiovascular;  Laterality: N/A;   NECK SURGERY     POLYPECTOMY  04/09/2022   Procedure: POLYPECTOMY;  Surgeon: Corbin Ade, MD;  [facility];  Service: Endoscopy;;   ROBOT ASSISTED LAPAROSCOPIC RADICAL PROSTATECTOMY  2009   SPINE SURGERY     SUBMUCOSAL LIFTING INJECTION  04/09/2022   Procedure: SUBMUCOSAL LIFTING INJECTION;  Surgeon: Corbin Ade, MD;  [facility];  Service: Endoscopy;;    Home Medications:  Allergies as of 04/09/2023       Reactions   Penicillins    Childhood allergy   Sulfonamide Derivatives    Pt does not remember reaction    Celebrex [celecoxib] Rash        Medication List        Accurate as of April 09, 2023  1:38 PM. If you have any questions, ask your nurse or doctor.          acetaminophen 500 MG tablet Commonly known as: TYLENOL Take 1,000 mg by mouth every 8 (eight) hours as needed for moderate pain.   albuterol 108 (90 Base) MCG/ACT inhaler Commonly known as: VENTOLIN HFA Inhale 2 puffs into the lungs every 4 (four) hours as needed for wheezing or shortness of breath.   atorvastatin 20 MG tablet Commonly known as: Lipitor Take 1 tablet (20 mg total) by mouth daily.   benzonatate 100 MG  capsule Commonly known as: TESSALON Take 1-2 capsules (100-200 mg total) by mouth 3 (three) times daily as needed for cough.   fluticasone 50 MCG/ACT nasal spray Commonly known as: FLONASE Place 1-2 sprays into both nostrils daily as needed for allergies.   levocetirizine 5 MG tablet Commonly known as: XYZAL Take 1 tablet (5 mg total) by mouth every evening.   olmesartan 40 MG tablet Commonly known as: BENICAR Take 40 mg by mouth daily.   polyethylene glycol-electrolytes 420 g solution Commonly known as: NuLYTELY As directed   tamsulosin 0.4 MG Caps capsule Commonly known as: FLOMAX Take 1 capsule (0.4 mg total) by mouth daily.        Allergies:  Allergies  Allergen Reactions    Penicillins     Childhood allergy   Sulfonamide Derivatives     Pt does not remember reaction    Celebrex [Celecoxib] Rash    Family History: Family History  Problem Relation Age of Onset   Hypertension Father     Social History:  reports that he quit smoking about 43 years ago. His smoking use included cigarettes. He has a 80.00 pack-year smoking history. He has never used smokeless tobacco. He reports current alcohol use of about 4.0 standard drinks of alcohol per week. He reports that he does not currently use drugs.  ROS: All other review of systems were reviewed and are negative except what is noted above in HPI  Physical Exam: BP 128/70   Pulse 65   Constitutional:  Alert and oriented, No acute distress. HEENT: [place] AT, moist mucus membranes.  Trachea midline, no masses. Cardiovascular: No clubbing, cyanosis, or edema. Respiratory: Normal respiratory effort, no increased work of breathing. GI: Abdomen is soft, nontender, nondistended, no abdominal masses GU: No CVA tenderness.  Lymph: No cervical or inguinal lymphadenopathy. Skin: No rashes, bruises or suspicious lesions. Neurologic: Grossly intact, no focal deficits, moving all 4 extremities. Psychiatric: Normal mood and affect.  Laboratory Data: Lab Results  Component Value Date   WBC 6.4 09/20/2021   HGB 14.9 09/20/2021   HCT 45.1 09/20/2021   MCV 89.1 09/20/2021   PLT 180 09/20/2021    Lab Results  Component Value Date   CREATININE 1.13 09/20/2021    No results found for: "PSA"  No results found for: "TESTOSTERONE"  No results found for: "HGBA1C"  Urinalysis    Component Value Date/Time   COLORURINE YELLOW 02/28/2021 0631   APPEARANCEUR Clear 11/26/2022 1014   LABSPEC 1.023 02/28/2021 0631   PHURINE 5.0 02/28/2021 0631   GLUCOSEU Negative 11/26/2022 1014   HGBUR NEGATIVE 02/28/2021 0631   BILIRUBINUR Negative 11/26/2022 1014   KETONESUR 5 (A) 02/28/2021 0631   PROTEINUR Negative 11/26/2022  1014   PROTEINUR NEGATIVE 02/28/2021 0631   UROBILINOGEN 0.2 05/28/2011 0746   NITRITE Negative 11/26/2022 1014   NITRITE NEGATIVE 02/28/2021 0631   LEUKOCYTESUR Negative 11/26/2022 1014   LEUKOCYTESUR TRACE (A) 02/28/2021 0631    Lab Results  Component Value Date   LABMICR Comment 11/26/2022   WBCUA None seen 03/20/2021   LABEPIT None seen 03/20/2021   MUCUS Present 03/01/2021   BACTERIA None seen 03/20/2021    Pertinent Imaging:  Results for orders placed in visit on 11/26/22  Abdomen 1 view (KUB)  Narrative CLINICAL DATA:  Right flank pain.  EXAM: ABDOMEN - 1 VIEW  COMPARISON:  April 12, 2021.  FINDINGS: The bowel gas pattern is normal. No nephrolithiasis is noted. Phleboliths are noted in the pelvis.  IMPRESSION: No  nephrolithiasis.  No abnormal bowel dilatation.   Electronically Signed By: Lupita Raider M.D. On: 11/26/2022 16:15  No results found for this or any previous visit.  No results found for this or any previous visit.  No results found for this or any previous visit.  No results found for this or any previous visit.  No valid procedures specified. No results found for this or any previous visit.  Results for orders placed during the hospital encounter of 02/28/21  CT Renal Stone Study  Narrative CLINICAL DATA:  Flank pain.  Kidney stone suspected.  EXAM: CT ABDOMEN AND PELVIS WITHOUT CONTRAST  TECHNIQUE: Multidetector CT imaging of the abdomen and pelvis was performed following the standard protocol without IV contrast.  COMPARISON:  CT angio chest 04/10/2019, CT abdomen pelvis 05/28/2011  FINDINGS: Lower chest: No acute abnormality.  Hepatobiliary: No focal liver abnormality. No gallstones, gallbladder wall thickening, or pericholecystic fluid. No biliary dilatation.  Pancreas: No focal lesion. Normal pancreatic contour. No surrounding inflammatory changes. No main pancreatic ductal dilatation.  Spleen: Normal in size  without focal abnormality.  Adrenals/Urinary Tract: No adrenal nodule bilaterally. 1-2 mm calcified stones within the right kidney. 1-2 mm calcified stone within the left kidney. There is a 7 mm calcified stone at the left ureteropelvic junction with associated proximal mild hydronephrosis. No right ureterolithiasis. No right hydronephrosis. No contour-deforming renal mass. No ureterolithiasis or hydroureter. The urinary bladder is decompressed with trace perivesicular fat stranding.  Stomach/Bowel: Stomach is within normal limits. No evidence of bowel wall thickening or dilatation. Appendix appears normal.  Vascular/Lymphatic: No abdominal aorta or iliac aneurysm. Mild atherosclerotic plaque. Prominent but nonenlarged abdomen lymph nodes. No abdominal, pelvic, or inguinal lymphadenopathy.  Reproductive: The prostate is not definitely identified.  Other: Trace free simple pelvic fluid. No intraperitoneal free gas. No organized fluid collection.  Musculoskeletal:  No abdominal wall hernia or abnormality.  No suspicious lytic or blastic osseous lesions. No acute displaced fracture.  IMPRESSION: 1. Obstructive 7 mm left ureteropelvic junction stone. Correlate with urinalysis for superimposed infection. 2. Nonobstructive bilateral 1-2 mm nephrolithiasis.   Electronically Signed By: Tish Frederickson M.D. On: 02/28/2021 07:03   Assessment & Plan:    1. Prostate cancer PSA today and if stable he will followup in 1 year with a PSA - Urinalysis, Routine w reflex microscopic  2. Nephrolithiasis -followup 1 year with KUB   No follow-ups on file.  Wilkie Aye, MD  [facility] [place]

## 2023-04-10 LAB — PSA: Prostate Specific Ag, Serum: <0.1 ng/mL (ref 0.0–4.0)

## 2023-04-16 ENCOUNTER — Encounter: Payer: Self-pay | Admitting: Urology

## 2023-04-16 NOTE — Patient Instructions (Signed)

## 2023-04-30 ENCOUNTER — Ambulatory Visit: Payer: BC Managed Care – PPO | Admitting: Internal Medicine

## 2023-04-30 ENCOUNTER — Encounter: Payer: Self-pay | Admitting: Internal Medicine

## 2023-04-30 VITALS — BP 115/74 | HR 60 | Temp 98.1°F | Ht 72.0 in | Wt 244.2 lb

## 2023-04-30 DIAGNOSIS — K602 Anal fissure, unspecified: Secondary | ICD-10-CM | POA: Diagnosis not present

## 2023-04-30 NOTE — Progress Notes (Signed)
Primary Care Physician:  Carylon Perches, MD Primary Gastroenterologist:  Dr. Jena Gauss  Pre-Procedure History & Physical: HPI:  [name] [name] is a 62 y.o. male here for several week history of rectal pain when having a bowel movement.  Complains of severe lancinating pain after having a bowel movement.  Gets to the point where he is afraid to have a bowel movement and may go 4 days without a bowel movement and then he really has a hard time passing stool.  Takes Colace on an as-needed basis.  Has blocked up his diet via fortified cereals and fruit.  Does not take a fiber supplement.  Occasionally sees bright red blood per rectum. "Schedules" bowel movement twice a day due to pain anticipated after movement.  He tends to ignore normal urges.   OTC topical agents have not helped much.  Colonoscopy just last year did demonstrate no significant anorectal abnormalities cecal adenoma removed; due for surveillance exam in 4 years.  History of prostate cancer.  Status post prostatectomy.  No radiation treatment.  Past Medical History:  Diagnosis Date   Childhood asthma    Diverticulitis    History of kidney stones    Hypercholesteremia    Hypertension    Prostate cancer [facility])     Past Surgical History:  Procedure Laterality Date   COLONOSCOPY WITH PROPOFOL N/A 04/09/2022   Procedure: COLONOSCOPY WITH PROPOFOL;  Surgeon: Corbin Ade, MD;  [facility];  Service: Endoscopy;  Laterality: N/A;  11:00am   EXTRACORPOREAL SHOCK WAVE LITHOTRIPSY Left 03/14/2021   Procedure: EXTRACORPOREAL SHOCK WAVE LITHOTRIPSY (ESWL);  Surgeon: Malen Gauze, MD;  Location: [facility];  Service: Urology;  Laterality: Left;   HOT HEMOSTASIS  04/09/2022   Procedure: HOT HEMOSTASIS (ARGON PLASMA COAGULATION/BICAP);  Surgeon: Corbin Ade, MD;  [facility];  Service: Endoscopy;;   LEFT HEART CATH AND CORONARY ANGIOGRAPHY N/A 09/20/2021   Procedure: LEFT HEART CATH AND CORONARY ANGIOGRAPHY;   Surgeon: Tonny Bollman, MD;  Location: [facility] INVASIVE CV LAB;  Service: Cardiovascular;  Laterality: N/A;   NECK SURGERY     POLYPECTOMY  04/09/2022   Procedure: POLYPECTOMY;  Surgeon: Corbin Ade, MD;  [facility];  Service: Endoscopy;;   ROBOT ASSISTED LAPAROSCOPIC RADICAL PROSTATECTOMY  2009   SPINE SURGERY     SUBMUCOSAL LIFTING INJECTION  04/09/2022   Procedure: SUBMUCOSAL LIFTING INJECTION;  Surgeon: Corbin Ade, MD;  [facility];  Service: Endoscopy;;    Prior to Admission medications   Medication Sig Start Date End Date Taking? Authorizing Provider  acetaminophen (TYLENOL) 500 MG tablet Take 1,000 mg by mouth every 8 (eight) hours as needed for moderate pain.   Yes [provider]  albuterol (VENTOLIN HFA) 108 (90 Base) MCG/ACT inhaler Inhale 2 puffs into the lungs every 4 (four) hours as needed for wheezing or shortness of breath. 10/26/21  Yes [provider]  fluticasone (FLONASE) 50 MCG/ACT nasal spray Place 1-2 sprays into both nostrils daily as needed for allergies.   Yes [provider]  olmesartan (BENICAR) 40 MG tablet Take 40 mg by mouth daily. 12/25/21  Yes [provider]    Allergies as of 04/30/2023 - Review Complete 04/30/2023  Allergen Reaction Noted   Penicillins     Sulfonamide derivatives     Celebrex [celecoxib] Rash     Family History  Problem Relation Age of Onset   Hypertension Father     Social History  Socioeconomic History   Marital status: Married    Spouse name: Not on file   Number of children: 1   Years of education: Not on file   Highest education level: Not on file  Occupational History   Occupation: Education administrator  Tobacco Use   Smoking status: Former    Packs/day: 2.00    Years: 40.00    Additional pack years: 0.00    Total pack years: 80.00    Types: Cigarettes    Quit date: 12/03/1979    Years since quitting: 43.4   Smokeless tobacco: Never  Vaping  Use   Vaping Use: Never used  Substance and Sexual Activity   Alcohol use: Yes    Alcohol/week: 4.0 standard drinks of alcohol    Types: 4 Cans of beer per week    Comment: couple drinks per week   Drug use: Not Currently   Sexual activity: Yes  Other Topics Concern   Not on file  Social History Narrative   Not on file   Social Determinants of Health   Financial Resource Strain: Not on file  Food Insecurity: Not on file  Transportation Needs: Not on file  Physical Activity: Not on file  Stress: Not on file  Social Connections: Not on file  Intimate Partner Violence: Not on file    Review of Systems: See HPI, otherwise negative ROS  Physical Exam: BP 115/74 (BP Location: Right Arm, Patient Position: Sitting, Cuff Size: Large)   Pulse 60   Temp 98.1 F (36.7 C) (Oral)   Ht 6' (1.829 m)   Wt 244 lb 3.2 oz (110.8 kg)   SpO2 97%   BMI 33.12 kg/m  General:   Alert, well-nourished, pleasant and cooperative in NAD Rectal examination he has small perianal skin tags.  Limited digital exam and inspection of the anus demonstrates what appears to be a fissure at the 3 o'clock position.  Exquisitely tender.   Impression/Plan: 62 year old gentleman with severe anal pain associated with bowel function.  Small-volume bleeding from time to time.  It appears she does have an anal fissure with associated hemorrhoids. Has developed a bit of a phobia to having a bowel movement due to anticipatory pain.  He schedules bowel movement by the clock and ignores urges to have a bowel movement which is counterproductive.   Notice: This dictation was prepared with Dragon dictation along with smaller phrase technology. Any transcriptional errors that result from this process are unintentional and may not be corrected upon review.   Recommendations:  I recommend primary fiber supplement be in the form of Benefiber 1 tablespoon daily for 3 weeks; then increase to 2 tablespoons thereafter.  Continue  stool softener twice a day-take every day  [facility] hemorrhoid cream (add Cardizem and lidocaine) 30 g.  Apply a pea-sized amount up into your anal canal 4 times a day.  Limit toilet time to 5 minutes.  Do not strain.  Do not ignore urges to have a bowel movement.  Follow-up appointment here in 4 weeks.

## 2023-04-30 NOTE — Patient Instructions (Signed)
It was good to see you again today!  Information on anal fissure and hemorrhoids provided  As discussed, I would like to see your bowel regimen streamlined as follows;  I recommend your primary fiber supplement be in the form of Benefiber 1 tablespoon daily for 3 weeks; then increase to 2 tablespoons thereafter.  Continue stool softener twice a day-take every day  [facility] hemorrhoid cream (add Cardizem and lidocaine) 30 g.  Apply a pea-sized amount up into your anal canal 4 times a day.  Toilet time to 5 minutes.  Do not strain.  Do not ignore urges to have a bowel movement.  Office visit here in 4 weeks.  Surveillance colonoscopy in 4 years.

## 2023-05-21 ENCOUNTER — Telehealth: Payer: Self-pay

## 2023-05-21 NOTE — Telephone Encounter (Signed)
Pt is requesting a refill on [facility] hemorrhoid cream. Last seen 04/30/23.

## 2023-05-22 NOTE — Telephone Encounter (Signed)
Refills were sent in and pt was made aware.

## 2023-07-03 DIAGNOSIS — M25552 Pain in left hip: Secondary | ICD-10-CM | POA: Diagnosis not present

## 2023-07-03 DIAGNOSIS — M545 Low back pain, unspecified: Secondary | ICD-10-CM | POA: Diagnosis not present

## 2023-07-12 ENCOUNTER — Ambulatory Visit: Payer: BC Managed Care – PPO | Admitting: Internal Medicine

## 2023-07-12 VITALS — BP 113/66 | HR 70 | Temp 97.3°F | Ht 72.0 in | Wt 246.2 lb

## 2023-07-12 DIAGNOSIS — K594 Anal spasm: Secondary | ICD-10-CM | POA: Diagnosis not present

## 2023-07-12 DIAGNOSIS — K5909 Other constipation: Secondary | ICD-10-CM | POA: Diagnosis not present

## 2023-07-12 DIAGNOSIS — K602 Anal fissure, unspecified: Secondary | ICD-10-CM | POA: Diagnosis not present

## 2023-07-12 NOTE — Patient Instructions (Signed)
It was good to see you again today!  As discussed we need to deal more directly with constipation and I believe your anorectal symptoms will settle down.  My recommendations are as follows:  Continue Colace but take no more than 100 mg twice daily  Continue Benefiber but take no more than 2 tablespoons daily  Add Linzess 145 mcg gelcap once daily as a laxative of choice.  Samples x 3 weeks.  New prescription dispense 30 with 11 refills  Continue using [employer] cream-apply a pea-sized amount to the anorectum twice daily  Call me in 2 to 3 weeks and let me know how things are going.  Office visit here in 6 weeks

## 2023-07-12 NOTE — Progress Notes (Unsigned)
Primary Care Physician:  Carylon Perches, MD Primary Gastroenterologist:  Dr. Jena Gauss  Pre-Procedure History & Physical: HPI:  [name] [name] is a 62 y.o. male here for continued anorectal complaints.  Felt to have a fissure seen on DRE last visit.  He has been on [employer] hemorrhoid cream (add lidocaine and Cardizem) pain is much improved; he notes progressing constipation and may go upwards of 3 days without a bowel movement.  He has been taking escalating doses of fiber and stool softener which have not really helped.  Thinks he may have a hemorrhoid.  Up-to-date on colonoscopy done last year.  The rectum looked good.  Cecal adenoma removed.  Past Medical History:  Diagnosis Date   Childhood asthma    Diverticulitis    History of kidney stones    Hypercholesteremia    Hypertension    Prostate cancer [facility])     Past Surgical History:  Procedure Laterality Date   COLONOSCOPY WITH PROPOFOL N/A 04/09/2022   Procedure: COLONOSCOPY WITH PROPOFOL;  Surgeon: Corbin Ade, MD;  [facility];  Service: Endoscopy;  Laterality: N/A;  11:00am   EXTRACORPOREAL SHOCK WAVE LITHOTRIPSY Left 03/14/2021   Procedure: EXTRACORPOREAL SHOCK WAVE LITHOTRIPSY (ESWL);  Surgeon: Malen Gauze, MD;  [facility];  Service: Urology;  Laterality: Left;   HOT HEMOSTASIS  04/09/2022   Procedure: HOT HEMOSTASIS (ARGON PLASMA COAGULATION/BICAP);  Surgeon: Corbin Ade, MD;  [facility];  Service: Endoscopy;;   LEFT HEART CATH AND CORONARY ANGIOGRAPHY N/A 09/20/2021   Procedure: LEFT HEART CATH AND CORONARY ANGIOGRAPHY;  Surgeon: Tonny Bollman, MD;  Location: [facility] INVASIVE CV LAB;  Service: Cardiovascular;  Laterality: N/A;   NECK SURGERY     POLYPECTOMY  04/09/2022   Procedure: POLYPECTOMY;  Surgeon: Corbin Ade, MD;  [facility];  Service: Endoscopy;;   ROBOT ASSISTED LAPAROSCOPIC RADICAL PROSTATECTOMY  2009   SPINE SURGERY     SUBMUCOSAL LIFTING  INJECTION  04/09/2022   Procedure: SUBMUCOSAL LIFTING INJECTION;  Surgeon: Corbin Ade, MD;  [facility];  Service: Endoscopy;;    Prior to Admission medications   Medication Sig Start Date End Date Taking? Authorizing Provider  acetaminophen (TYLENOL) 500 MG tablet Take 1,000 mg by mouth every 8 (eight) hours as needed for moderate pain.   Yes [provider]  fluticasone (FLONASE) 50 MCG/ACT nasal spray Place 1-2 sprays into both nostrils daily as needed for allergies.   Yes [provider]  olmesartan (BENICAR) 40 MG tablet Take 40 mg by mouth daily. 12/25/21  Yes [provider]  albuterol (VENTOLIN HFA) 108 (90 Base) MCG/ACT inhaler Inhale 2 puffs into the lungs every 4 (four) hours as needed for wheezing or shortness of breath. Patient not taking: Reported on 07/12/2023 10/26/21   [provider]    Allergies as of 07/12/2023 - Review Complete 07/12/2023  Allergen Reaction Noted   Penicillins     Sulfonamide derivatives     Celebrex [celecoxib] Rash     Family History  Problem Relation Age of Onset   Hypertension Father     Social History   Socioeconomic History   Marital status: Married    Spouse name: Not on file   Number of children: 1   Years of education: Not on file   Highest education level: Not on file  Occupational History   Occupation: Education administrator  Tobacco Use   Smoking status: Former  Current packs/day: 0.00    Average packs/day: 2.0 packs/day for 40.0 years (80.0 ttl pk-yrs)    Types: Cigarettes    Start date: 12/03/1939    Quit date: 12/03/1979    Years since quitting: 43.6   Smokeless tobacco: Never  Vaping Use   Vaping status: Never Used  Substance and Sexual Activity   Alcohol use: Yes    Alcohol/week: 4.0 standard drinks of alcohol    Types: 4 Cans of beer per week    Comment: couple drinks per week   Drug use: Not Currently   Sexual activity: Yes  Other Topics Concern   Not on  file  Social History Narrative   Not on file   Social Determinants of Health   Financial Resource Strain: Not on file  Food Insecurity: Not on file  Transportation Needs: Not on file  Physical Activity: Not on file  Stress: Not on file  Social Connections: Not on file  Intimate Partner Violence: Not on file    Review of Systems: See HPI, otherwise negative ROS  Physical Exam: BP 113/66 (BP Location: Right Arm, Patient Position: Sitting, Cuff Size: Large)   Pulse 70   Temp (!) 97.3 F (36.3 C) (Temporal)   Ht 6' (1.829 m)   Wt 246 lb 3.2 oz (111.7 kg)   SpO2 97%   BMI 33.39 kg/m  General:   Alert,  Well-developed, well-nourished, pleasant and cooperative in NAD Abdomen: Non-distended, normal bowel sounds.  Soft and nontender without appreciable mass or hepatosplenomegaly.   Impression/Plan: Pleasant 62 year old gentleman with symptomatic anal fissure.  Pain has improved.  Has had a decrease in bowel function.  He has been taking escalating/megadoses of stool softeners and fiber to combat the symptoms.  This is likely counterproductive. We need to focus treatment on constipation and hopefully his anorectal symptoms will improve.  Recommendations:  Continue Colace but take no more than 100 mg twice daily  Continue Benefiber but take no more than 2 tablespoons daily  Add Linzess 145 mcg gelcap once daily as a laxative of choice.  Samples x 3 weeks.  New prescription dispense 30 with 11 refills  Continue using [employer] [facility] cream-apply a pea-sized amount to the anorectum twice daily  Call with a progress report in 2 to 3 weeks and let me know how things are going.  Office visit here in 6 weeks     Notice: This dictation was prepared with Dragon dictation along with smaller phrase technology. Any transcriptional errors that result from this process are unintentional and may not be corrected upon review.

## 2023-07-25 DIAGNOSIS — M353 Polymyalgia rheumatica: Secondary | ICD-10-CM | POA: Diagnosis not present

## 2023-07-30 ENCOUNTER — Other Ambulatory Visit: Payer: Self-pay

## 2023-07-30 MED ORDER — LINACLOTIDE 145 MCG PO CAPS: 145.0000 ug | ORAL_CAPSULE | Freq: Every day | ORAL | 11 refills | Status: DC

## 2023-08-06 DIAGNOSIS — M353 Polymyalgia rheumatica: Secondary | ICD-10-CM | POA: Diagnosis not present

## 2023-08-20 DIAGNOSIS — M353 Polymyalgia rheumatica: Secondary | ICD-10-CM | POA: Diagnosis not present

## 2023-08-23 DIAGNOSIS — M353 Polymyalgia rheumatica: Secondary | ICD-10-CM | POA: Diagnosis not present

## 2023-08-27 ENCOUNTER — Ambulatory Visit: Payer: BC Managed Care – PPO | Admitting: Internal Medicine

## 2023-08-27 ENCOUNTER — Encounter: Payer: Self-pay | Admitting: Internal Medicine

## 2023-08-27 ENCOUNTER — Other Ambulatory Visit: Payer: Self-pay | Admitting: *Deleted

## 2023-08-27 VITALS — BP 108/70 | HR 62 | Temp 98.1°F | Ht 72.0 in | Wt 250.6 lb

## 2023-08-27 DIAGNOSIS — K5909 Other constipation: Secondary | ICD-10-CM | POA: Diagnosis not present

## 2023-08-27 DIAGNOSIS — K602 Anal fissure, unspecified: Secondary | ICD-10-CM

## 2023-08-27 DIAGNOSIS — K594 Anal spasm: Secondary | ICD-10-CM

## 2023-08-27 NOTE — Patient Instructions (Addendum)
It was good to see you again today!  You appear to have a nonhealing anal fissure.  Further medical therapy is not likely to be effective.  In your case, I recommend you see Dr. [name] for consideration of surgical relief of this problem  We will refer you for an ASAP appointment.  Continue Colace daily  Continue Benefiber daily  Continue Linzess daily  We will refill your [facility] hemorrhoid cream (add Cardizem and lidocaine)  Plan for repeat colonoscopy in about 4 years  Office visit here in 3 months

## 2023-08-27 NOTE — Progress Notes (Signed)
Primary Care Physician:  Carylon Perches, MD Primary Gastroenterologist:  Dr. Jena Gauss  Pre-Procedure History & Physical: HPI:  [name] [name] is a 62 y.o. male here for follow-up of anorectal pain.  Felt to have a fissure.  Linzess [employer] [facility] hemorrhoid cream added to his regimen recently.  Provides transient relief bowel function is better with Linzess.  But still has quite a bit of pain.  Anxiety anticipating his next bowel movement which will cause pain.  He has to posture when he sits to take pressure off.  He is up-to-date on colonoscopy.  Past Medical History:  Diagnosis Date   Childhood asthma    Diverticulitis    History of kidney stones    Hypercholesteremia    Hypertension    Prostate cancer [facility])     Past Surgical History:  Procedure Laterality Date   COLONOSCOPY WITH PROPOFOL N/A 04/09/2022   Procedure: COLONOSCOPY WITH PROPOFOL;  Surgeon: Corbin Ade, MD;  [facility];  Service: Endoscopy;  Laterality: N/A;  11:00am   EXTRACORPOREAL SHOCK WAVE LITHOTRIPSY Left 03/14/2021   Procedure: EXTRACORPOREAL SHOCK WAVE LITHOTRIPSY (ESWL);  Surgeon: Malen Gauze, MD;  Location: [facility];  Service: Urology;  Laterality: Left;   HOT HEMOSTASIS  04/09/2022   Procedure: HOT HEMOSTASIS (ARGON PLASMA COAGULATION/BICAP);  Surgeon: Corbin Ade, MD;  [facility];  Service: Endoscopy;;   LEFT HEART CATH AND CORONARY ANGIOGRAPHY N/A 09/20/2021   Procedure: LEFT HEART CATH AND CORONARY ANGIOGRAPHY;  Surgeon: Tonny Bollman, MD;  Location: [facility] INVASIVE CV LAB;  Service: Cardiovascular;  Laterality: N/A;   NECK SURGERY     POLYPECTOMY  04/09/2022   Procedure: POLYPECTOMY;  Surgeon: Corbin Ade, MD;  [facility];  Service: Endoscopy;;   ROBOT ASSISTED LAPAROSCOPIC RADICAL PROSTATECTOMY  2009   SPINE SURGERY     SUBMUCOSAL LIFTING INJECTION  04/09/2022   Procedure: SUBMUCOSAL LIFTING INJECTION;  Surgeon: Corbin Ade, MD;  [facility];  Service: Endoscopy;;    Prior to Admission medications   Medication Sig Start Date End Date Taking? Authorizing Provider  hydrocortisone 2.5 % cream Apply topically. 07/15/23  Yes [provider]  linaclotide Karlene Einstein) 145 MCG CAPS capsule Take 1 capsule (145 mcg total) by mouth daily before breakfast. 07/30/23  Yes Meeghan Skipper, Gerrit Friends, MD  olmesartan (BENICAR) 40 MG tablet Take 40 mg by mouth daily. 12/25/21  Yes [provider]  predniSONE (DELTASONE) 5 MG tablet Take 15 mg by mouth daily. 08/17/23  Yes [provider]    Allergies as of 08/27/2023 - Review Complete 08/27/2023  Allergen Reaction Noted   Penicillins     Sulfonamide derivatives     Celebrex [celecoxib] Rash     Family History  Problem Relation Age of Onset   Hypertension Father     Social History   Socioeconomic History   Marital status: Married    Spouse name: Not on file   Number of children: 1   Years of education: Not on file   Highest education level: Not on file  Occupational History   Occupation: Education administrator  Tobacco Use   Smoking status: Former    Current packs/day: 0.00    Types: Cigarettes    Quit date: 12/03/1979    Years since quitting: 43.7   Smokeless tobacco: Never  Vaping Use   Vaping status: Never Used  Substance and Sexual Activity   Alcohol use: Yes    Alcohol/week: 4.0  standard drinks of alcohol    Types: 4 Cans of beer per week    Comment: couple drinks per week   Drug use: Not Currently   Sexual activity: Yes  Other Topics Concern   Not on file  Social History Narrative   Not on file   Social Determinants of Health   Financial Resource Strain: Not on file  Food Insecurity: Not on file  Transportation Needs: Not on file  Physical Activity: Not on file  Stress: Not on file  Social Connections: Not on file  Intimate Partner Violence: Not on file    Review of Systems: See HPI, otherwise negative ROS  Physical Exam: BP  108/70 (BP Location: Left Arm, Patient Position: Sitting, Cuff Size: Large)   Pulse 62   Temp 98.1 F (36.7 C) (Oral)   Ht 6' (1.829 m)   Wt 250 lb 9.6 oz (113.7 kg)   SpO2 96%   BMI 33.99 kg/m  General:   Alert,  Well-developed, well-nourished, pleasant and cooperative in NAD Neck:  Supple; no masses or thyromegaly. No significant cervical adenopathy. Lungs:  Clear throughout to auscultation.   No wheezes, crackles, or rhonchi. No acute distress. Heart:  Regular rate and rhythm; no murmurs, clicks, rubs,  or gallops. Abdomen: Non-distended, normal bowel sounds.  Soft and nontender without appreciable mass or hepatosplenomegaly.  Rectal: Somewhat tender digital rectal examination no rectal abnormalities.  Rectal vault empty he does have a palpable defect in the anal rectal area more at the 12 o'clock position.  Significantly tender.  I do not appreciate an abscess.   Impression/Plan: Pleasant 62 year old gentleman with anorectal pain constipation improved with Linzess.  He appears to have a nonhealing anal fissure which has failed conservative therapy at this time.  He is quite uncomfortable.  I feel he would benefit from more definitive (surgical) treatment of this problem.  Recommendations:   I recommend you see Dr. [name] for consideration of surgical relief of this problem  We will refer you for an ASAP appointment.  Continue Colace daily  Continue Benefiber daily  Continue Linzess daily  We will refill your [facility] hemorrhoid cream (add Cardizem and lidocaine)  Plan for repeat colonoscopy in about 4 years   Notice: This dictation was prepared with Dragon dictation along with smaller phrase technology. Any transcriptional errors that result from this process are unintentional and may not be corrected upon review.

## 2023-09-17 ENCOUNTER — Other Ambulatory Visit: Payer: Self-pay

## 2023-09-17 ENCOUNTER — Ambulatory Visit: Payer: BC Managed Care – PPO | Admitting: General Surgery

## 2023-09-17 ENCOUNTER — Encounter: Payer: Self-pay | Admitting: General Surgery

## 2023-09-17 VITALS — BP 119/73 | HR 59 | Temp 98.3°F | Resp 18 | Ht 72.0 in | Wt 252.0 lb

## 2023-09-17 DIAGNOSIS — K602 Anal fissure, unspecified: Secondary | ICD-10-CM

## 2023-09-18 NOTE — Progress Notes (Signed)
[name] [name]; [id number]; Jul 15, 1961   HPI Patient is a 62 year old white male who was referred to my care by Dr. Carylon Perches and Dr. Jena Gauss for evaluation and treatment of rectal pain.  Patient has been treated for approximately 6 to 8 months for an anal fissure.  He has tried multiple topical creams.  He is also has tried to avoid constipation.  He continues to have rectal pain with bowel movements.  He occasionally has bright red blood on the toilet paper when he wipes himself.  He has had a recent colonoscopy. Past Medical History:  Diagnosis Date   Childhood asthma    Diverticulitis    History of kidney stones    Hypercholesteremia    Hypertension    Prostate cancer [facility])     Past Surgical History:  Procedure Laterality Date   COLONOSCOPY WITH PROPOFOL N/A 04/09/2022   Procedure: COLONOSCOPY WITH PROPOFOL;  Surgeon: Corbin Ade, MD;  Location: [facility];  Service: Endoscopy;  Laterality: N/A;  11:00am   EXTRACORPOREAL SHOCK WAVE LITHOTRIPSY Left 03/14/2021   Procedure: EXTRACORPOREAL SHOCK WAVE LITHOTRIPSY (ESWL);  Surgeon: Malen Gauze, MD;  Location: [facility];  Service: Urology;  Laterality: Left;   HOT HEMOSTASIS  04/09/2022   Procedure: HOT HEMOSTASIS (ARGON PLASMA COAGULATION/BICAP);  Surgeon: Corbin Ade, MD;  Location: [facility];  Service: Endoscopy;;   LEFT HEART CATH AND CORONARY ANGIOGRAPHY N/A 09/20/2021   Procedure: LEFT HEART CATH AND CORONARY ANGIOGRAPHY;  Surgeon: Tonny Bollman, MD;  Location: [facility] INVASIVE CV LAB;  Service: Cardiovascular;  Laterality: N/A;   NECK SURGERY     POLYPECTOMY  04/09/2022   Procedure: POLYPECTOMY;  Surgeon: Corbin Ade, MD;  [facility]: [facility];  Service: Endoscopy;;   ROBOT ASSISTED LAPAROSCOPIC RADICAL PROSTATECTOMY  2009   SPINE SURGERY     SUBMUCOSAL LIFTING INJECTION  04/09/2022   Procedure: SUBMUCOSAL LIFTING INJECTION;  Surgeon: Corbin Ade, MD;  [facility]: [facility];  Service: Endoscopy;;    Family  History  Problem Relation Age of Onset   Hypertension Father     Current Outpatient Medications on File Prior to Visit  Medication Sig Dispense Refill   hydrocortisone 2.5 % cream Apply topically. Compounded cream at [facility]     linaclotide [facility]) 145 MCG CAPS capsule Take 1 capsule (145 mcg total) by mouth daily before breakfast. 30 capsule 11   olmesartan (BENICAR) 40 MG tablet Take 40 mg by mouth daily.     predniSONE (DELTASONE) 5 MG tablet Take 15 mg by mouth daily.     No current facility-administered medications on file prior to visit.    Allergies  Allergen Reactions   Penicillins     Childhood allergy   Sulfonamide Derivatives     Pt does not remember reaction    Celebrex [Celecoxib] Rash    Social History   Substance and Sexual Activity  Alcohol Use Yes   Alcohol/week: 4.0 standard drinks of alcohol   Types: 4 Cans of beer per week   Comment: couple drinks per week    Social History   Tobacco Use  Smoking Status Former   Current packs/day: 0.00   Types: Cigarettes   Quit date: 12/03/1979   Years since quitting: 43.8  Smokeless Tobacco Never    Review of Systems  Constitutional: Negative.   HENT: Negative.    Eyes: Negative.   Respiratory:  Positive for shortness of breath and wheezing.   Cardiovascular: Negative.   Gastrointestinal:  Negative.   Genitourinary: Negative.   Musculoskeletal:  Positive for back pain, joint pain and neck pain.  Skin: Negative.   Neurological: Negative.   Endo/Heme/Allergies: Negative.   Psychiatric/Behavioral: Negative.      Objective   Vitals:   09/17/23 1502  BP: 119/73  Pulse: (!) 59  Resp: 18  Temp: 98.3 F (36.8 C)  SpO2: 96%    Physical Exam Vitals reviewed.  Constitutional:      Appearance: Normal appearance. He is not ill-appearing.  HENT:     Head: Normocephalic and atraumatic.  Cardiovascular:     Rate and Rhythm: Normal rate and regular rhythm.     Heart sounds: Normal heart  sounds. No murmur heard.    No friction rub. No gallop.  Pulmonary:     Effort: Pulmonary effort is normal. No respiratory distress.     Breath sounds: Normal breath sounds. No stridor. No wheezing, rhonchi or rales.  Genitourinary:    Comments: Small residual posterior anal fissure with sentinel tag present.  No active bleeding noted.  Patient did have pain on digital examination. Skin:    General: Skin is warm and dry.  Neurological:     Mental Status: He is alert and oriented to person, place, and time.     Assessment  Chronic anal fissure, moderate relief with topical creams, though it appears that he has reached a plateau with medical treatment. Plan  Will proceed with an anal fissurectomy on 10/02/2023.  The risks and benefits of the procedure including bleeding, infection, and the possibility of recurrence of the fissure were fully explained to the patient, who gave informed consent.  I did offer him referral to a colorectal surgeon but he would like to try this procedure at the present time.

## 2023-09-18 NOTE — H&P (Signed)
[name] [name]; [id number]; Jul 15, 1961   HPI Patient is a 62 year old white male who was referred to my care by Dr. Carylon Perches and Dr. Jena Gauss for evaluation and treatment of rectal pain.  Patient has been treated for approximately 6 to 8 months for an anal fissure.  He has tried multiple topical creams.  He is also has tried to avoid constipation.  He continues to have rectal pain with bowel movements.  He occasionally has bright red blood on the toilet paper when he wipes himself.  He has had a recent colonoscopy. Past Medical History:  Diagnosis Date   Childhood asthma    Diverticulitis    History of kidney stones    Hypercholesteremia    Hypertension    Prostate cancer [facility])     Past Surgical History:  Procedure Laterality Date   COLONOSCOPY WITH PROPOFOL N/A 04/09/2022   Procedure: COLONOSCOPY WITH PROPOFOL;  Surgeon: Corbin Ade, MD;  [facility];  Service: Endoscopy;  Laterality: N/A;  11:00am   EXTRACORPOREAL SHOCK WAVE LITHOTRIPSY Left 03/14/2021   Procedure: EXTRACORPOREAL SHOCK WAVE LITHOTRIPSY (ESWL);  Surgeon: Malen Gauze, MD;  Location: [facility];  Service: Urology;  Laterality: Left;   HOT HEMOSTASIS  04/09/2022   Procedure: HOT HEMOSTASIS (ARGON PLASMA COAGULATION/BICAP);  Surgeon: Corbin Ade, MD;  [facility]: [facility];  Service: Endoscopy;;   LEFT HEART CATH AND CORONARY ANGIOGRAPHY N/A 09/20/2021   Procedure: LEFT HEART CATH AND CORONARY ANGIOGRAPHY;  Surgeon: Tonny Bollman, MD;  Location: [facility] INVASIVE CV LAB;  Service: Cardiovascular;  Laterality: N/A;   NECK SURGERY     POLYPECTOMY  04/09/2022   Procedure: POLYPECTOMY;  Surgeon: Corbin Ade, MD;  [facility];  Service: Endoscopy;;   ROBOT ASSISTED LAPAROSCOPIC RADICAL PROSTATECTOMY  2009   SPINE SURGERY     SUBMUCOSAL LIFTING INJECTION  04/09/2022   Procedure: SUBMUCOSAL LIFTING INJECTION;  Surgeon: Corbin Ade, MD;  [facility]: [facility];  Service: Endoscopy;;    Family  History  Problem Relation Age of Onset   Hypertension Father     Current Outpatient Medications on File Prior to Visit  Medication Sig Dispense Refill   hydrocortisone 2.5 % cream Apply topically. Compounded cream at [facility]     linaclotide [facility]) 145 MCG CAPS capsule Take 1 capsule (145 mcg total) by mouth daily before breakfast. 30 capsule 11   olmesartan (BENICAR) 40 MG tablet Take 40 mg by mouth daily.     predniSONE (DELTASONE) 5 MG tablet Take 15 mg by mouth daily.     No current facility-administered medications on file prior to visit.    Allergies  Allergen Reactions   Penicillins     Childhood allergy   Sulfonamide Derivatives     Pt does not remember reaction    Celebrex [Celecoxib] Rash    Social History   Substance and Sexual Activity  Alcohol Use Yes   Alcohol/week: 4.0 standard drinks of alcohol   Types: 4 Cans of beer per week   Comment: couple drinks per week    Social History   Tobacco Use  Smoking Status Former   Current packs/day: 0.00   Types: Cigarettes   Quit date: 12/03/1979   Years since quitting: 43.8  Smokeless Tobacco Never    Review of Systems  Constitutional: Negative.   HENT: Negative.    Eyes: Negative.   Respiratory:  Positive for shortness of breath and wheezing.   Cardiovascular: Negative.   Gastrointestinal:  Negative.   Genitourinary: Negative.   Musculoskeletal:  Positive for back pain, joint pain and neck pain.  Skin: Negative.   Neurological: Negative.   Endo/Heme/Allergies: Negative.   Psychiatric/Behavioral: Negative.      Objective   Vitals:   09/17/23 1502  BP: 119/73  Pulse: (!) 59  Resp: 18  Temp: 98.3 F (36.8 C)  SpO2: 96%    Physical Exam Vitals reviewed.  Constitutional:      Appearance: Normal appearance. He is not ill-appearing.  HENT:     Head: Normocephalic and atraumatic.  Cardiovascular:     Rate and Rhythm: Normal rate and regular rhythm.     Heart sounds: Normal heart  sounds. No murmur heard.    No friction rub. No gallop.  Pulmonary:     Effort: Pulmonary effort is normal. No respiratory distress.     Breath sounds: Normal breath sounds. No stridor. No wheezing, rhonchi or rales.  Genitourinary:    Comments: Small residual posterior anal fissure with sentinel tag present.  No active bleeding noted.  Patient did have pain on digital examination. Skin:    General: Skin is warm and dry.  Neurological:     Mental Status: He is alert and oriented to person, place, and time.     Assessment  Chronic anal fissure, moderate relief with topical creams, though it appears that he has reached a plateau with medical treatment. Plan  Will proceed with an anal fissurectomy on 10/02/2023.  The risks and benefits of the procedure including bleeding, infection, and the possibility of recurrence of the fissure were fully explained to the patient, who gave informed consent.  I did offer him referral to a colorectal surgeon but he would like to try this procedure at the present time.

## 2023-09-26 NOTE — Patient Instructions (Signed)
[name] [name]  09/26/2023     @PREFPERIOPPHARMACY @   Your procedure is scheduled on  10/02/2023.   Report to [facility] at  0600  A.M.   Call this number if you have problems the morning of surgery:  [phone]  If you experience any cold or flu symptoms such as cough, fever, chills, shortness of breath, etc. between now and your scheduled surgery, please notify us at the above number.   Remember:  Do not eat after midnight.   You may drink clear liquids until 0330 am on 10/02/2023.     Clear liquids allowed are:                    Water, Black Coffee Only (No creamer, milk or cream, including half & half and powdered creamer), and Clear Sports drink (No red color; diabetics please choose diet or no sugar options)     Take these medicines the morning of surgery with A SIP OF WATER                                             prednisone.    Do not wear jewelry, make-up or nail polish, including gel polish,  artificial nails, or any other type of covering on natural nails (fingers and  toes).  Do not wear lotions, powders, or perfumes, or deodorant.  Do not shave 48 hours prior to surgery.  Men may shave face and neck.  Do not bring valuables to the hospital.  [facility] is not responsible for any belongings or valuables.  Contacts, dentures or bridgework may not be worn into surgery.  Leave your suitcase in the car.  After surgery it may be brought to your room.  For patients admitted to the hospital, discharge time will be determined by your treatment team.  Patients discharged the day of surgery will not be allowed to drive home and must have someone with them for 24 hours.    Special instructions:   DO NOT smoke tobacco or vape for 24 hours before your procedure.  Please read over the following fact sheets that you were given. Anesthesia Post-op Instructions and Care and Recovery After Surgery         Anal Fissure, Adult  An anal fissure is a  small tear or crack in the tissue near the opening of the butt (anus). In most cases, bleeding from the tear or crack stops on its own within a few minutes. You may have bleeding each time you poop until the tear or crack heals. What are the causes? Passing a large or hard poop (stool). Having trouble pooping (constipation). Having watery poops (diarrhea). An inflammatory bowel disease, like Crohn's disease or ulcerative colitis. Childbirth. Infections. Anal sex. What are the signs or symptoms? Bleeding from the butt. Small amounts of blood on your poop. The blood coats the outside of the poop. It is not mixed with the poop. Small amounts of blood on the toilet paper or in the toilet after you poop. Pain when you poop. Itching or irritation around your butt. How is this treated? Treatment may include: Making changes to what you eat and drink. This can help if you have trouble pooping. Taking fiber supplements. These can help make your poop soft. Taking warm water baths (sitz baths). These can help heal the  tear. Using creams and ointments. If other treatments do not work, you may need: A shot near the tear or crack (botulinum injection). Surgery to fix the tear or crack. Follow these instructions at home: Medicines Take over-the-counter and prescription medicines only as told by your doctor. This includes creams and ointments that have medicine in them. Use medicines to make your poop soft as told by your doctor. Treating constipation You may need to take these actions to prevent or treat trouble pooping: Drink enough fluid to keep your pee (urine) pale yellow. Eat foods that are high in fiber. These include beans, whole grains, and fresh fruits and vegetables. Stay away from unripe bananas. Ripe bananas are a good choice. Limit foods that are high in fat and sugar. These include fried or sweet foods. Avoid dairy products. This includes milk.  General instructions  Keep the butt  area clean and dry. Take a warm water bath as told by your doctor. Do not use soap. Contact a doctor if: You have more bleeding. You have a fever. You have watery poop that is mixed with blood. Your pain does not go away. Your problems get worse. This information is not intended to replace advice given to you by your health care provider. Make sure you discuss any questions you have with your health care provider. Document Revised: 12/20/2022 Document Reviewed: 12/20/2022 Elsevier Patient Education  2024 [employer] General Anesthesia, Adult, Care After The following information offers guidance on how to care for yourself after your procedure. Your health care provider may also give you more specific instructions. If you have problems or questions, contact your health care provider. What can I expect after the procedure? After the procedure, it is common for people to: Have pain or discomfort at the IV site. Have nausea or vomiting. Have a sore throat or hoarseness. Have trouble concentrating. Feel cold or chills. Feel weak, sleepy, or tired (fatigue). Have soreness and body aches. These can affect parts of the body that were not involved in surgery. Follow these instructions at home: For the time period you were told by your health care provider:  Rest. Do not participate in activities where you could fall or become injured. Do not drive or use machinery. Do not drink alcohol. Do not take sleeping pills or medicines that cause drowsiness. Do not make important decisions or sign legal documents. Do not take care of children on your own. General instructions Drink enough fluid to keep your urine pale yellow. If you have sleep apnea, surgery and certain medicines can increase your risk for breathing problems. Follow instructions from your health care provider about wearing your sleep device: Anytime you are sleeping, including during daytime naps. While taking prescription pain  medicines, sleeping medicines, or medicines that make you drowsy. Return to your normal activities as told by your health care provider. Ask your health care provider what activities are safe for you. Take over-the-counter and prescription medicines only as told by your health care provider. Do not use any products that contain nicotine or tobacco. These products include cigarettes, chewing tobacco, and vaping devices, such as e-cigarettes. These can delay incision healing after surgery. If you need help quitting, ask your health care provider. Contact a health care provider if: You have nausea or vomiting that does not get better with medicine. You vomit every time you eat or drink. You have pain that does not get better with medicine. You cannot urinate or have bloody urine. You develop a skin  rash. You have a fever. Get help right away if: You have trouble breathing. You have chest pain. You vomit blood. These symptoms may be an emergency. Get help right away. Call 911. Do not wait to see if the symptoms will go away. Do not drive yourself to the hospital. Summary After the procedure, it is common to have a sore throat, hoarseness, nausea, vomiting, or to feel weak, sleepy, or fatigue. For the time period you were told by your health care provider, do not drive or use machinery. Get help right away if you have difficulty breathing, have chest pain, or vomit blood. These symptoms may be an emergency. This information is not intended to replace advice given to you by your health care provider. Make sure you discuss any questions you have with your health care provider. Document Revised: 03/02/2022 Document Reviewed: 03/02/2022 Elsevier Patient Education  2024 [employer] Inc. How to Use Chlorhexidine Before Surgery Chlorhexidine gluconate (CHG) is a germ-killing (antiseptic) solution that is used to clean the skin. It can get rid of the bacteria that normally live on the skin and can keep  them away for about 24 hours. To clean your skin with CHG, you may be given: A CHG solution to use in the shower or as part of a sponge bath. A prepackaged cloth that contains CHG. Cleaning your skin with CHG may help lower the risk for infection: While you are staying in the intensive care unit of the hospital. If you have a vascular access, such as a central line, to provide short-term or long-term access to your veins. If you have a catheter to drain urine from your bladder. If you are on a ventilator. A ventilator is a machine that helps you breathe by moving air in and out of your lungs. After surgery. What are the risks? Risks of using CHG include: A skin reaction. Hearing loss, if CHG gets in your ears and you have a perforated eardrum. Eye injury, if CHG gets in your eyes and is not rinsed out. The CHG product catching fire. Make sure that you avoid smoking and flames after applying CHG to your skin. Do not use CHG: If you have a chlorhexidine allergy or have previously reacted to chlorhexidine. On babies younger than 11 months of age. How to use CHG solution Use CHG only as told by your health care provider, and follow the instructions on the label. Use the full amount of CHG as directed. Usually, this is one bottle. During a shower Follow these steps when using CHG solution during a shower (unless your health care provider gives you different instructions): Start the shower. Use your normal soap and shampoo to wash your face and hair. Turn off the shower or move out of the shower stream. Pour the CHG onto a clean washcloth. Do not use any type of brush or rough-edged sponge. Starting at your neck, lather your body down to your toes. Make sure you follow these instructions: If you will be having surgery, pay special attention to the part of your body where you will be having surgery. Scrub this area for at least 1 minute. Do not use CHG on your head or face. If the solution gets  into your ears or eyes, rinse them well with water. Avoid your genital area. Avoid any areas of skin that have broken skin, cuts, or scrapes. Scrub your back and under your arms. Make sure to wash skin folds. Let the lather sit on your skin for 1-2  minutes or as long as told by your health care provider. Thoroughly rinse your entire body in the shower. Make sure that all body creases and crevices are rinsed well. Dry off with a clean towel. Do not put any substances on your body afterward--such as powder, lotion, or perfume--unless you are told to do so by your health care provider. Only use lotions that are recommended by the manufacturer. Put on clean clothes or pajamas. If it is the night before your surgery, sleep in clean sheets.  During a sponge bath Follow these steps when using CHG solution during a sponge bath (unless your health care provider gives you different instructions): Use your normal soap and shampoo to wash your face and hair. Pour the CHG onto a clean washcloth. Starting at your neck, lather your body down to your toes. Make sure you follow these instructions: If you will be having surgery, pay special attention to the part of your body where you will be having surgery. Scrub this area for at least 1 minute. Do not use CHG on your head or face. If the solution gets into your ears or eyes, rinse them well with water. Avoid your genital area. Avoid any areas of skin that have broken skin, cuts, or scrapes. Scrub your back and under your arms. Make sure to wash skin folds. Let the lather sit on your skin for 1-2 minutes or as long as told by your health care provider. Using a different clean, wet washcloth, thoroughly rinse your entire body. Make sure that all body creases and crevices are rinsed well. Dry off with a clean towel. Do not put any substances on your body afterward--such as powder, lotion, or perfume--unless you are told to do so by your health care provider. Only  use lotions that are recommended by the manufacturer. Put on clean clothes or pajamas. If it is the night before your surgery, sleep in clean sheets. How to use CHG prepackaged cloths Only use CHG cloths as told by your health care provider, and follow the instructions on the label. Use the CHG cloth on clean, dry skin. Do not use the CHG cloth on your head or face unless your health care provider tells you to. When washing with the CHG cloth: Avoid your genital area. Avoid any areas of skin that have broken skin, cuts, or scrapes. Before surgery Follow these steps when using a CHG cloth to clean before surgery (unless your health care provider gives you different instructions): Using the CHG cloth, vigorously scrub the part of your body where you will be having surgery. Scrub using a back-and-forth motion for 3 minutes. The area on your body should be completely wet with CHG when you are done scrubbing. Do not rinse. Discard the cloth and let the area air-dry. Do not put any substances on the area afterward, such as powder, lotion, or perfume. Put on clean clothes or pajamas. If it is the night before your surgery, sleep in clean sheets.  For general bathing Follow these steps when using CHG cloths for general bathing (unless your health care provider gives you different instructions). Use a separate CHG cloth for each area of your body. Make sure you wash between any folds of skin and between your fingers and toes. Wash your body in the following order, switching to a new cloth after each step: The front of your neck, shoulders, and chest. Both of your arms, under your arms, and your hands. Your stomach and groin area, avoiding  the genitals. Your right leg and foot. Your left leg and foot. The back of your neck, your back, and your buttocks. Do not rinse. Discard the cloth and let the area air-dry. Do not put any substances on your body afterward--such as powder, lotion, or  perfume--unless you are told to do so by your health care provider. Only use lotions that are recommended by the manufacturer. Put on clean clothes or pajamas. Contact a health care provider if: Your skin gets irritated after scrubbing. You have questions about using your solution or cloth. You swallow any chlorhexidine. Call your local poison control center [phone] in the [place] Get help right away if: Your eyes itch badly, or they become very red or swollen. Your skin itches badly and is red or swollen. Your hearing changes. You have trouble seeing. You have swelling or tingling in your mouth or throat. You have trouble breathing. These symptoms may represent a serious problem that is an emergency. Do not wait to see if the symptoms will go away. Get medical help right away. Call your local emergency services (911 in the [place] Do not drive yourself to the hospital. Summary Chlorhexidine gluconate (CHG) is a germ-killing (antiseptic) solution that is used to clean the skin. Cleaning your skin with CHG may help to lower your risk for infection. You may be given CHG to use for bathing. It may be in a bottle or in a prepackaged cloth to use on your skin. Carefully follow your health care provider's instructions and the instructions on the product label. Do not use CHG if you have a chlorhexidine allergy. Contact your health care provider if your skin gets irritated after scrubbing. This information is not intended to replace advice given to you by your health care provider. Make sure you discuss any questions you have with your health care provider. Document Revised: 04/02/2022 Document Reviewed: 02/13/2021 Elsevier Patient Education  2023 [employer].

## 2023-09-27 ENCOUNTER — Encounter (HOSPITAL_COMMUNITY): Payer: Self-pay

## 2023-09-27 ENCOUNTER — Encounter (HOSPITAL_COMMUNITY)
Admission: RE | Admit: 2023-09-27 | Discharge: 2023-09-27 | Disposition: A | Discharge status: Home / Self Care | Payer: BC Managed Care – PPO | Source: Ambulatory Visit | Attending: General Surgery | Admitting: General Surgery

## 2023-09-27 VITALS — BP 111/68 | HR 70 | Temp 98.6°F | Resp 18 | Ht 72.0 in | Wt 248.2 lb

## 2023-09-27 DIAGNOSIS — Z01818 Encounter for other preprocedural examination: Secondary | ICD-10-CM | POA: Insufficient documentation

## 2023-09-27 DIAGNOSIS — I10 Essential (primary) hypertension: Secondary | ICD-10-CM | POA: Diagnosis not present

## 2023-09-27 HISTORY — DX: Polymyalgia rheumatica: M35.3

## 2023-10-02 ENCOUNTER — Ambulatory Visit (HOSPITAL_COMMUNITY)
Admission: RE | Admit: 2023-10-02 | Discharge: 2023-10-02 | Disposition: A | Discharge status: Home / Self Care | Payer: BC Managed Care – PPO | Attending: General Surgery | Admitting: General Surgery

## 2023-10-02 ENCOUNTER — Encounter (HOSPITAL_COMMUNITY)
Admission: RE | Disposition: A | Discharge status: Home / Self Care | Payer: Self-pay | Source: Home / Self Care | Attending: General Surgery

## 2023-10-02 ENCOUNTER — Other Ambulatory Visit: Payer: Self-pay

## 2023-10-02 ENCOUNTER — Ambulatory Visit (HOSPITAL_COMMUNITY): Payer: BC Managed Care – PPO | Admitting: Certified Registered Nurse Anesthetist

## 2023-10-02 ENCOUNTER — Encounter (HOSPITAL_COMMUNITY): Payer: Self-pay | Admitting: General Surgery

## 2023-10-02 DIAGNOSIS — I10 Essential (primary) hypertension: Secondary | ICD-10-CM | POA: Insufficient documentation

## 2023-10-02 DIAGNOSIS — K602 Anal fissure, unspecified: Secondary | ICD-10-CM

## 2023-10-02 DIAGNOSIS — K648 Other hemorrhoids: Secondary | ICD-10-CM | POA: Insufficient documentation

## 2023-10-02 DIAGNOSIS — K601 Chronic anal fissure: Secondary | ICD-10-CM | POA: Insufficient documentation

## 2023-10-02 DIAGNOSIS — Z87891 Personal history of nicotine dependence: Secondary | ICD-10-CM | POA: Diagnosis not present

## 2023-10-02 HISTORY — PX: FISSURECTOMY: SHX5244

## 2023-10-02 SURGERY — FISSURECTOMY, ANAL
Anesthesia: General

## 2023-10-02 MED ORDER — PROPOFOL 10 MG/ML IV BOLUS
INTRAVENOUS | Status: DC | PRN
  Administered 2023-10-02: 200 mg via INTRAVENOUS

## 2023-10-02 MED ORDER — CHLORHEXIDINE GLUCONATE CLOTH 2 % EX PADS: 6.0000 | MEDICATED_PAD | Freq: Once | CUTANEOUS | Status: DC

## 2023-10-02 MED ORDER — PROPOFOL 10 MG/ML IV BOLUS
INTRAVENOUS | Status: AC
  Filled 2023-10-02: qty 20

## 2023-10-02 MED ORDER — ONDANSETRON HCL 4 MG/2ML IJ SOLN
INTRAMUSCULAR | Status: DC | PRN
  Administered 2023-10-02: 4 mg via INTRAVENOUS

## 2023-10-02 MED ORDER — HYDROMORPHONE HCL 1 MG/ML IJ SOLN
INTRAMUSCULAR | Status: AC
  Filled 2023-10-02: qty 0.5

## 2023-10-02 MED ORDER — LIDOCAINE HCL (CARDIAC) PF 100 MG/5ML IV SOSY
PREFILLED_SYRINGE | INTRAVENOUS | Status: DC | PRN
  Administered 2023-10-02: 100 mg via INTRAVENOUS

## 2023-10-02 MED ORDER — LIDOCAINE HCL (PF) 2 % IJ SOLN
INTRAMUSCULAR | Status: AC
  Filled 2023-10-02: qty 5

## 2023-10-02 MED ORDER — BUPIVACAINE HCL (PF) 0.5 % IJ SOLN
INTRAMUSCULAR | Status: AC
  Filled 2023-10-02: qty 30

## 2023-10-02 MED ORDER — OXYCODONE HCL 5 MG PO TABS
5.0000 mg | ORAL_TABLET | Freq: Once | ORAL | Status: AC | PRN
  Administered 2023-10-02: 5 mg via ORAL
  Filled 2023-10-02: qty 1

## 2023-10-02 MED ORDER — FENTANYL CITRATE PF 50 MCG/ML IJ SOSY
PREFILLED_SYRINGE | INTRAMUSCULAR | Status: AC
  Filled 2023-10-02: qty 1

## 2023-10-02 MED ORDER — LIDOCAINE VISCOUS HCL 2 % MT SOLN
OROMUCOSAL | Status: AC
  Filled 2023-10-02: qty 15

## 2023-10-02 MED ORDER — LACTATED RINGERS IV SOLN: INTRAVENOUS | Status: DC

## 2023-10-02 MED ORDER — SURGILUBE EX GEL
CUTANEOUS | Status: DC | PRN
  Administered 2023-10-02: 1 via TOPICAL

## 2023-10-02 MED ORDER — MIDAZOLAM HCL 5 MG/5ML IJ SOLN
INTRAMUSCULAR | Status: DC | PRN
  Administered 2023-10-02: 2 mg via INTRAVENOUS

## 2023-10-02 MED ORDER — FENTANYL CITRATE PF 50 MCG/ML IJ SOSY
25.0000 ug | PREFILLED_SYRINGE | INTRAMUSCULAR | Status: DC | PRN
  Administered 2023-10-02 (×4): 50 ug via INTRAVENOUS
  Filled 2023-10-02 (×3): qty 1

## 2023-10-02 MED ORDER — KETOROLAC TROMETHAMINE 30 MG/ML IJ SOLN
30.0000 mg | Freq: Once | INTRAMUSCULAR | Status: AC
  Administered 2023-10-02: 30 mg via INTRAVENOUS

## 2023-10-02 MED ORDER — CHLORHEXIDINE GLUCONATE 0.12 % MT SOLN
15.0000 mL | Freq: Once | OROMUCOSAL | Status: AC
  Administered 2023-10-02: 15 mL via OROMUCOSAL

## 2023-10-02 MED ORDER — KETOROLAC TROMETHAMINE 30 MG/ML IJ SOLN
INTRAMUSCULAR | Status: AC
  Filled 2023-10-02: qty 1

## 2023-10-02 MED ORDER — DEXAMETHASONE SODIUM PHOSPHATE 10 MG/ML IJ SOLN
INTRAMUSCULAR | Status: AC
  Filled 2023-10-02: qty 1

## 2023-10-02 MED ORDER — DEXAMETHASONE SODIUM PHOSPHATE 10 MG/ML IJ SOLN
INTRAMUSCULAR | Status: DC | PRN
  Administered 2023-10-02: 10 mg via INTRAVENOUS

## 2023-10-02 MED ORDER — BUPIVACAINE HCL (PF) 0.5 % IJ SOLN
INTRAMUSCULAR | Status: DC | PRN
  Administered 2023-10-02: 15 mL

## 2023-10-02 MED ORDER — OXYCODONE HCL 5 MG PO TABS
5.0000 mg | ORAL_TABLET | Freq: Four times a day (QID) | ORAL | 0 refills | Status: DC | PRN
Start: 2023-10-02 — End: 2023-11-18

## 2023-10-02 MED ORDER — ONDANSETRON HCL 4 MG/2ML IJ SOLN
4.0000 mg | Freq: Once | INTRAMUSCULAR | Status: DC | PRN
  Filled 2023-10-02: qty 2

## 2023-10-02 MED ORDER — FENTANYL CITRATE (PF) 100 MCG/2ML IJ SOLN
INTRAMUSCULAR | Status: AC
  Filled 2023-10-02: qty 2

## 2023-10-02 MED ORDER — OXYCODONE HCL 5 MG/5ML PO SOLN: 5.0000 mg | Freq: Once | ORAL | Status: AC | PRN

## 2023-10-02 MED ORDER — LIDOCAINE VISCOUS HCL 2 % MT SOLN
OROMUCOSAL | Status: DC | PRN
Start: 2023-10-02 — End: 2023-10-02
  Administered 2023-10-02: 1

## 2023-10-02 MED ORDER — HYDROMORPHONE HCL 1 MG/ML IJ SOLN
0.5000 mg | Freq: Once | INTRAMUSCULAR | Status: AC
  Administered 2023-10-02: 0.5 mg via INTRAVENOUS

## 2023-10-02 MED ORDER — SODIUM CHLORIDE 0.9 % IR SOLN
Status: DC | PRN
  Administered 2023-10-02: 1000 mL

## 2023-10-02 MED ORDER — CHLORHEXIDINE GLUCONATE 0.12 % MT SOLN: 15.0000 mL | Freq: Once | OROMUCOSAL | Status: DC

## 2023-10-02 MED ORDER — MIDAZOLAM HCL 2 MG/2ML IJ SOLN
INTRAMUSCULAR | Status: AC
  Filled 2023-10-02: qty 2

## 2023-10-02 MED ORDER — ROCURONIUM BROMIDE 10 MG/ML (PF) SYRINGE
PREFILLED_SYRINGE | INTRAVENOUS | Status: AC
  Filled 2023-10-02: qty 10

## 2023-10-02 MED ORDER — ORAL CARE MOUTH RINSE: 15.0000 mL | Freq: Once | OROMUCOSAL | Status: DC

## 2023-10-02 MED ORDER — FENTANYL CITRATE (PF) 100 MCG/2ML IJ SOLN
INTRAMUSCULAR | Status: DC | PRN
  Administered 2023-10-02: 100 ug via INTRAVENOUS

## 2023-10-02 MED ORDER — ONDANSETRON HCL 4 MG/2ML IJ SOLN
INTRAMUSCULAR | Status: AC
  Filled 2023-10-02: qty 2

## 2023-10-02 MED ORDER — SODIUM CHLORIDE 0.9 % IV SOLN
2.0000 g | INTRAVENOUS | Status: AC
  Administered 2023-10-02: 2 g via INTRAVENOUS
  Filled 2023-10-02: qty 2

## 2023-10-02 SURGICAL SUPPLY — 25 items
CLOTH BEACON ORANGE TIMEOUT ST (SAFETY) ×1 IMPLANT
COVER LIGHT HANDLE STERIS (MISCELLANEOUS) ×2 IMPLANT
DISSECTOR SURG LIGASURE 21 (MISCELLANEOUS) IMPLANT
DRAPE HALF SHEET 40X57 (DRAPES) ×1 IMPLANT
ELECT REM PT RETURN 9FT ADLT (ELECTROSURGICAL) ×1
ELECTRODE REM PT RTRN 9FT ADLT (ELECTROSURGICAL) ×1 IMPLANT
GAUZE SPONGE 4X4 12PLY STRL (GAUZE/BANDAGES/DRESSINGS) ×2 IMPLANT
GLOVE BIOGEL PI IND STRL 7.0 (GLOVE) ×2 IMPLANT
GLOVE SURG SS PI 7.5 STRL IVOR (GLOVE) ×2 IMPLANT
GOWN STRL REUS W/TWL LRG LVL3 (GOWN DISPOSABLE) ×2 IMPLANT
HEMOSTAT SURGICEL 4X8 (HEMOSTASIS) ×1 IMPLANT
KIT TURNOVER KIT A (KITS) ×1 IMPLANT
MANIFOLD NEPTUNE II (INSTRUMENTS) ×1 IMPLANT
NDL HYPO 18GX1.5 BLUNT FILL (NEEDLE) ×1 IMPLANT
NDL HYPO 21X1.5 SAFETY (NEEDLE) ×1 IMPLANT
NEEDLE HYPO 18GX1.5 BLUNT FILL (NEEDLE) ×1
NEEDLE HYPO 21X1.5 SAFETY (NEEDLE) ×1
NS IRRIG 1000ML POUR BTL (IV SOLUTION) ×1 IMPLANT
PACK PERI GYN (CUSTOM PROCEDURE TRAY) ×1 IMPLANT
PAD ARMBOARD 7.5X6 YLW CONV (MISCELLANEOUS) ×1 IMPLANT
POSITIONER HEAD 8X9X4 ADT (SOFTGOODS) ×1 IMPLANT
SET BASIN LINEN APH (SET/KITS/TRAYS/PACK) ×1 IMPLANT
SUT SILK 0 FSL (SUTURE) ×1 IMPLANT
SUT VIC AB 2-0 CT2 27 (SUTURE) ×1 IMPLANT
SYR 30ML LL (SYRINGE) ×2 IMPLANT

## 2023-10-02 NOTE — Anesthesia Preprocedure Evaluation (Signed)
Anesthesia Evaluation  Patient identified by MRN, date of birth, ID band Patient awake    Reviewed: Allergy & Precautions, H&P , NPO status , Patient's Chart, lab work & pertinent test results, reviewed documented beta blocker date and time   Airway Mallampati: II  TM Distance: >3 FB Neck ROM: full    Dental no notable dental hx.    Pulmonary neg pulmonary ROS, asthma , former smoker   Pulmonary exam normal breath sounds clear to auscultation       Cardiovascular Exercise Tolerance: Good hypertension, + angina  negative cardio ROS  Rhythm:regular Rate:Normal     Neuro/Psych  Neuromuscular disease negative neurological ROS  negative psych ROS   GI/Hepatic negative GI ROS, Neg liver ROS,,,  Endo/Other  negative endocrine ROS    Renal/GU Renal diseasenegative Renal ROS  negative genitourinary   Musculoskeletal   Abdominal   Peds  Hematology negative hematology ROS (+)   Anesthesia Other Findings   Reproductive/Obstetrics negative OB ROS                             Anesthesia Physical Anesthesia Plan  ASA: 2  Anesthesia Plan: General and General LMA   Post-op Pain Management:    Induction:   PONV Risk Score and Plan: Ondansetron  Airway Management Planned:   Additional Equipment:   Intra-op Plan:   Post-operative Plan:   Informed Consent: I have reviewed the patients History and Physical, chart, labs and discussed the procedure including the risks, benefits and alternatives for the proposed anesthesia with the patient or authorized representative who has indicated his/her understanding and acceptance.     Dental Advisory Given  Plan Discussed with: CRNA  Anesthesia Plan Comments:        Anesthesia Quick Evaluation

## 2023-10-02 NOTE — Transfer of Care (Signed)
Immediate Anesthesia Transfer of Care Note  Patient: [name] [name]  Procedure(s) Performed: FISSURECTOMY, ANAL  Patient Location: PACU  Anesthesia Type:General  Level of Consciousness: awake, alert , oriented, and patient cooperative  Airway & Oxygen Therapy: Patient Spontanous Breathing and Patient connected to face mask oxygen  Post-op Assessment: Report given to RN, Post -op Vital signs reviewed and stable, and Patient moving all extremities X 4  Post vital signs: Reviewed and stable  Last Vitals:  Vitals Value Taken Time  BP 115/82 10/02/23 0837  Temp 36.4 C 10/02/23 0837  Pulse 65 10/02/23 0842  Resp 14 10/02/23 0842  SpO2 98 % 10/02/23 0842  Vitals shown include unfiled device data.  Last Pain:  Vitals:   10/02/23 0645  TempSrc: Oral  PainSc: 4       Patients Stated Pain Goal: 5 (10/02/23 0645)  Complications: No notable events documented.

## 2023-10-02 NOTE — Discharge Instructions (Signed)
Take a stool softener daily.  Use medicated wipes.

## 2023-10-02 NOTE — Anesthesia Procedure Notes (Signed)
Procedure Name: LMA Insertion Date/Time: 10/02/2023 8:00 AM  Performed by: Shanon Payor, CRNAPre-anesthesia Checklist: Patient identified, Emergency Drugs available, Suction available, Patient being monitored and Timeout performed Patient Re-evaluated:Patient Re-evaluated prior to induction Oxygen Delivery Method: Circle system utilized Preoxygenation: Pre-oxygenation with 100% oxygen Induction Type: IV induction LMA: LMA inserted LMA Size: 5.0 Number of attempts: 1 Placement Confirmation: positive ETCO2, CO2 detector and breath sounds checked- equal and bilateral Tube secured with: Tape Dental Injury: Teeth and Oropharynx as per pre-operative assessment

## 2023-10-02 NOTE — Interval H&P Note (Signed)
History and Physical Interval Note:  10/02/2023 7:07 AM  [name] [name]  has presented today for surgery, with the diagnosis of ANAL FISSURE.  The various methods of treatment have been discussed with the patient and family. After consideration of risks, benefits and other options for treatment, the patient has consented to  Procedure(s): FISSURECTOMY, ANAL (N/A) as a surgical intervention.  The patient's history has been reviewed, patient examined, no change in status, stable for surgery.  I have reviewed the patient's chart and labs.  Questions were answered to the patient's satisfaction.     [name]

## 2023-10-02 NOTE — Op Note (Signed)
Patient:  MAGIC MOHLER  DOB:  1961-04-01  MRN:  [id number]   Preop Diagnosis: Anal fissure  Postop Diagnosis: Same  Procedure: Anal fissurectomy  Surgeon: [name], MD  Anes: General  Indications: Patient is a 62 year old white male who presents with an anal fissure.  He has failed medical therapy.  The risks and benefits of the procedure including bleeding, infection, and recurrence of the fissure were fully explained to the patient, who gave informed consent.  Procedure note: The patient was placed in the lithotomy position after general anesthesia was administered.  The perineum was prepped and draped using the usual sterile technique with Betadine.  Surgical site confirmation was performed.  On endoscopy, the posterior anal fissure was noted at the 6 o'clock position.  A small sentinel tag was also present.  Mild internal hemorrhoidal disease was noted.  The internal sphincter did not appear to be strictured.  The sentinel tag was removed using the LigaSure.  The fissure was excised and flaps were made on either side of the mucosa.  The mucosa was closed over the external sphincter using 2-0 Vicryl interrupted sutures.  0.5% Sensorcaine was instilled into the surrounding perineum.  Surgicel and viscous Xylocaine rectal packing was then placed.  All tape and needle counts were correct at the end of the procedure.  The patient was awakened and transferred to PACU in stable condition.  Complications: None  EBL: Minimal  Specimen: None

## 2023-10-03 NOTE — Anesthesia Postprocedure Evaluation (Signed)
Anesthesia Post Note  Patient: [name] [name]  Procedure(s) Performed: FISSURECTOMY, ANAL  Patient location during evaluation: Phase II Anesthesia Type: General Level of consciousness: awake Pain management: pain level controlled Vital Signs Assessment: post-procedure vital signs reviewed and stable Respiratory status: spontaneous breathing and respiratory function stable Cardiovascular status: blood pressure returned to baseline and stable Postop Assessment: no headache and no apparent nausea or vomiting Anesthetic complications: no Comments: Late entry   No notable events documented.   Last Vitals:  Vitals:   10/02/23 0930 10/02/23 0943  BP: 131/87 133/80  Pulse: (!) 59 60  Resp: 11 15  Temp:  36.4 C  SpO2: 99% 100%    Last Pain:  Vitals:   10/02/23 0943  TempSrc:   PainSc: 5                  Windell Norfolk

## 2023-10-04 DIAGNOSIS — M353 Polymyalgia rheumatica: Secondary | ICD-10-CM | POA: Diagnosis not present

## 2023-10-08 ENCOUNTER — Telehealth: Payer: Self-pay | Admitting: *Deleted

## 2023-10-08 NOTE — Telephone Encounter (Signed)
Surgical Date: 10/02/2023 Procedure: FISSURECTOMY, ANAL   Received call from patient [phone]~ telephone.   Patient reports that he is healing well. States that bleeding has almost stopped. States that he would like to begin sitz baths after BM's.   Discussed with Dr. Lovell Sheehan who is agreeable to plan. Call placed to patient and provider recommendations given.     How often: Take a sitz bath at least four times a day, and after each bowel movement.  How long: Sit in the warm water for 15-20 minutes.  How to take a sitz bath: You can use a clean bathtub or a sitz bath bowl placed on the toilet. Fill the tub or bowl with 3-4 inches of warm water. You can add salt or Epsom Salts as desired. What to expect: It's normal to experience some bleeding, discharge, or itching during recovery. The water may cause pain at first if you have a wound, but the pain should ease.  You should also avoid strenuous activity for one week after your procedure. You can resume your normal diet, but you should add some roughage and take a high fiber diet.

## 2023-10-09 ENCOUNTER — Encounter (HOSPITAL_COMMUNITY): Payer: Self-pay | Admitting: General Surgery

## 2023-10-09 DIAGNOSIS — M353 Polymyalgia rheumatica: Secondary | ICD-10-CM | POA: Diagnosis not present

## 2023-10-09 DIAGNOSIS — K602 Anal fissure, unspecified: Secondary | ICD-10-CM | POA: Diagnosis not present

## 2023-10-15 ENCOUNTER — Ambulatory Visit: Payer: BC Managed Care – PPO | Admitting: General Surgery

## 2023-10-15 ENCOUNTER — Encounter: Payer: Self-pay | Admitting: General Surgery

## 2023-10-15 VITALS — BP 117/74 | HR 61 | Temp 98.4°F | Resp 16 | Ht 72.0 in | Wt 248.0 lb

## 2023-10-15 DIAGNOSIS — Z09 Encounter for follow-up examination after completed treatment for conditions other than malignant neoplasm: Secondary | ICD-10-CM

## 2023-10-15 MED ORDER — METHOCARBAMOL 750 MG PO TABS
750.0000 mg | ORAL_TABLET | Freq: Three times a day (TID) | ORAL | 0 refills | Status: DC | PRN
Start: 2023-10-15 — End: 2024-02-14

## 2023-10-15 NOTE — Progress Notes (Signed)
Subjective:     [name] [name]  Patient here for postoperative visit, status post anal fissurectomy.  He is still having some rectal spasm.  He denies any blood per rectum.  He does see noticeable improvement since his surgery.  His bowel movements are soft and regular. Objective:    BP 117/74   Pulse 61   Temp 98.4 F (36.9 C) (Oral)   Resp 16   Ht 6' (1.829 m)   Wt 248 lb (112.5 kg)   SpO2 96%   BMI 33.63 kg/m   General:  alert, cooperative, and no distress  Rectum is healing well.  No bleeding noted.     Assessment:    Doing well postoperatively. Normal postoperative pain.    Plan:   I did prescribe Robaxin for muscle spasms in the rectum.  I gave him samples of Rectiv to control the pain.  He will follow-up with me as needed.

## 2023-10-22 ENCOUNTER — Ambulatory Visit: Payer: BC Managed Care – PPO | Admitting: General Surgery

## 2023-10-29 ENCOUNTER — Telehealth: Payer: Self-pay | Admitting: *Deleted

## 2023-10-29 NOTE — Telephone Encounter (Signed)
Surgical Date: 10/02/2023 Procedure: FISSURECTOMY, ANAL   Received call from patient [phone]~ telephone.   Patient reports that he continues to have increased pain at the base of incision. Reports that pain worsens with defecation. Also reports that he noted white strings hanging from the incision.   Advised that pain is to be expected at this stage of healing. Advised that strings are sutures and will dissolve. Advised that patient can be seen to have sutures clipped.   Patient states that he will call back if he would like to schedule appointment.

## 2023-11-04 ENCOUNTER — Encounter: Payer: Self-pay | Admitting: Internal Medicine

## 2023-11-04 DIAGNOSIS — M353 Polymyalgia rheumatica: Secondary | ICD-10-CM | POA: Diagnosis not present

## 2023-11-18 ENCOUNTER — Encounter: Payer: Self-pay | Admitting: Internal Medicine

## 2023-11-18 ENCOUNTER — Ambulatory Visit (INDEPENDENT_AMBULATORY_CARE_PROVIDER_SITE_OTHER): Payer: BC Managed Care – PPO | Admitting: Internal Medicine

## 2023-11-18 VITALS — BP 129/76 | HR 64 | Temp 98.6°F | Ht 72.0 in | Wt 259.2 lb

## 2023-11-18 DIAGNOSIS — K602 Anal fissure, unspecified: Secondary | ICD-10-CM | POA: Diagnosis not present

## 2023-11-18 DIAGNOSIS — K6289 Other specified diseases of anus and rectum: Secondary | ICD-10-CM

## 2023-11-18 DIAGNOSIS — K625 Hemorrhage of anus and rectum: Secondary | ICD-10-CM | POA: Diagnosis not present

## 2023-11-18 NOTE — Patient Instructions (Addendum)
It was good to see you again today!  As discussed, fissure surgery is slowly healing.  Continue Benefiber 2 tablespoons daily  Continue Colace 100 mg twice daily  Continue to avoid straining  Avoid use of a heating pad.  Sitz bath's as needed preferred  [employer] hemorrhoid cream (add lidocaine and Cardizem).  Apply pea-sized amount into the anorectum up to 4 times daily as needed.  Dispense 30 g with 1 years additional refill.  Office visit with me in 3 months

## 2023-11-18 NOTE — Progress Notes (Unsigned)
Primary Care Physician:  Carylon Perches, MD Primary Gastroenterologist:  Dr.   Pre-Procedure History & Physical: HPI:  [name] [name] is a 62 y.o. male here for follow-up of symptomatic anal fissure who underwent internal sphincterotomy and minor hemorrhoidectomy about 6 weeks ago by Dr. Lovell Sheehan.  On balance, he has improved significantly he still has pain that occurs after passing a bowel movement.  Had significant rectal bleeding self-limiting about 2 weeks ago.  Continues on Benefiber 2 tablespoons daily, Colace 100 mg twice daily.  Uses [employer] hemorrhoid cream as needed.  Uses heating pad which provides some relief sitz bath's did better perioperatively.  Past Medical History:  Diagnosis Date   Childhood asthma    Diverticulitis    History of kidney stones    Hypercholesteremia    Hypertension    PMR (polymyalgia rheumatica) (HCC)    Prostate cancer [facility])     Past Surgical History:  Procedure Laterality Date   COLONOSCOPY WITH PROPOFOL N/A 04/09/2022   Procedure: COLONOSCOPY WITH PROPOFOL;  Surgeon: Corbin Ade, MD;  [facility];  Service: Endoscopy;  Laterality: N/A;  11:00am   EXTRACORPOREAL SHOCK WAVE LITHOTRIPSY Left 03/14/2021   Procedure: EXTRACORPOREAL SHOCK WAVE LITHOTRIPSY (ESWL);  Surgeon: Malen Gauze, MD;  Location: [facility];  Service: Urology;  Laterality: Left;   FISSURECTOMY N/A 10/02/2023   Procedure: FISSURECTOMY, ANAL;  Surgeon: [name], MD;  Location: [facility];  Service: General;  Laterality: N/A;   HOT HEMOSTASIS  04/09/2022   Procedure: HOT HEMOSTASIS (ARGON PLASMA COAGULATION/BICAP);  Surgeon: Corbin Ade, MD;  [facility];  Service: Endoscopy;;   LEFT HEART CATH AND CORONARY ANGIOGRAPHY N/A 09/20/2021   Procedure: LEFT HEART CATH AND CORONARY ANGIOGRAPHY;  Surgeon: Tonny Bollman, MD;  Location: [facility] INVASIVE CV LAB;  Service: Cardiovascular;  Laterality: N/A;   NECK SURGERY     POLYPECTOMY  04/09/2022    Procedure: POLYPECTOMY;  Surgeon: Corbin Ade, MD;  [facility];  Service: Endoscopy;;   ROBOT ASSISTED LAPAROSCOPIC RADICAL PROSTATECTOMY  2009   SPINE SURGERY     SUBMUCOSAL LIFTING INJECTION  04/09/2022   Procedure: SUBMUCOSAL LIFTING INJECTION;  Surgeon: Corbin Ade, MD;  [facility];  Service: Endoscopy;;    Prior to Admission medications   Medication Sig Start Date End Date Taking? Authorizing Provider  docusate sodium (COLACE) 100 MG capsule Take 100 mg by mouth daily.   Yes [provider]  hydrocortisone 2.5 % cream Apply 1 Application topically daily. 07/15/23  Yes [provider]  linaclotide Karlene Einstein) 145 MCG CAPS capsule Take 1 capsule (145 mcg total) by mouth daily before breakfast. 07/30/23  Yes Daisuke Bailey, Gerrit Friends, MD  methocarbamol (ROBAXIN-750) 750 MG tablet Take 1 tablet (750 mg total) by mouth every 8 (eight) hours as needed for muscle spasms. 10/15/23  Yes [name], MD  olmesartan (BENICAR) 40 MG tablet Take 40 mg by mouth daily. 12/25/21  Yes [provider]  predniSONE (DELTASONE) 5 MG tablet Take 15 mg by mouth daily. 08/17/23  Yes [provider]  Wheat Dextrin (BENEFIBER PO) Take 1 tablet by mouth daily.   Yes [provider]    Allergies as of 11/18/2023 - Review Complete 11/18/2023  Allergen Reaction Noted   Penicillins     Sulfonamide derivatives     Celebrex [celecoxib] Rash     Family History  Problem Relation Age of Onset   Hypertension Father  Social History   Socioeconomic History   Marital status: Married    Spouse name: Not on file   Number of children: 1   Years of education: Not on file   Highest education level: Not on file  Occupational History   Occupation: Education administrator  Tobacco Use   Smoking status: Former    Current packs/day: 0.00    Types: Cigarettes    Quit date: 12/03/1979    Years since quitting: 43.9   Smokeless tobacco: Never  Vaping  Use   Vaping status: Never Used  Substance and Sexual Activity   Alcohol use: Yes    Alcohol/week: 4.0 standard drinks of alcohol    Types: 4 Cans of beer per week    Comment: couple drinks per week   Drug use: Not Currently   Sexual activity: Yes  Other Topics Concern   Not on file  Social History Narrative   Not on file   Social Determinants of Health   Financial Resource Strain: Not on file  Food Insecurity: Not on file  Transportation Needs: Not on file  Physical Activity: Not on file  Stress: Not on file  Social Connections: Not on file  Intimate Partner Violence: Not on file    Review of Systems: See HPI, otherwise negative ROS  Physical Exam: BP 129/76 (BP Location: Right Arm, Patient Position: Sitting, Cuff Size: Large)   Pulse 64   Temp 98.6 F (37 C) (Oral)   Ht 6' (1.829 m)   Wt 259 lb 3.2 oz (117.6 kg)   SpO2 97%   BMI 35.15 kg/m    General:   Alert,  Well-developed, well-nourished, pleasant and cooperative in NAD  Impression/Plan: 62 year old gentleman with rectal bleeding and proctalgia secondary to symptomatic anal fissure status post internal sphincterotomy and minor hemorrhoidectomy.  He is progressing satisfactorily postoperatively.  He still has significant discomfort and health symptoms and had self-limited bleeding following surgery.  He is steadily improving albeit slowly  Recommendations:  As discussed, fissure surgery is slowly healing.  Continue Benefiber 2 tablespoons daily  Continue Colace 100 mg twice daily  Continue to avoid straining  Avoid use of a heating pad.  Sitz bath's as needed preferred  [employer] hemorrhoid cream (add lidocaine and Cardizem).  Apply pea-sized amount into the anorectum up to 4 times daily as needed.  Dispense 30 g with 1 years additional refill.  Office visit with me in 3 months  Notice: This dictation was prepared with Dragon dictation along with smaller phrase technology. Any transcriptional  errors that result from this process are unintentional and may not be corrected upon review.

## 2023-11-19 DIAGNOSIS — M353 Polymyalgia rheumatica: Secondary | ICD-10-CM | POA: Diagnosis not present

## 2024-01-14 DIAGNOSIS — M353 Polymyalgia rheumatica: Secondary | ICD-10-CM | POA: Diagnosis not present

## 2024-01-14 DIAGNOSIS — K602 Anal fissure, unspecified: Secondary | ICD-10-CM | POA: Diagnosis not present

## 2024-02-14 ENCOUNTER — Encounter: Payer: Self-pay | Admitting: Internal Medicine

## 2024-02-14 ENCOUNTER — Ambulatory Visit: Payer: BC Managed Care – PPO | Admitting: Internal Medicine

## 2024-02-14 VITALS — BP 128/76 | HR 63 | Temp 98.1°F | Ht 72.0 in | Wt 261.0 lb

## 2024-02-14 DIAGNOSIS — K625 Hemorrhage of anus and rectum: Secondary | ICD-10-CM

## 2024-02-14 DIAGNOSIS — Z860101 Personal history of adenomatous and serrated colon polyps: Secondary | ICD-10-CM | POA: Diagnosis not present

## 2024-02-14 DIAGNOSIS — K921 Melena: Secondary | ICD-10-CM

## 2024-02-14 DIAGNOSIS — B3782 Candidal enteritis: Secondary | ICD-10-CM | POA: Diagnosis not present

## 2024-02-14 DIAGNOSIS — K602 Anal fissure, unspecified: Secondary | ICD-10-CM

## 2024-02-14 MED ORDER — KETOCONAZOLE 2 % EX CREA: 1.0000 | TOPICAL_CREAM | Freq: Three times a day (TID) | CUTANEOUS | 0 refills | Status: DC

## 2024-02-14 NOTE — Patient Instructions (Signed)
 Good to see you again today!  Agree with stopping Linzess  Continue Benefiber  Okay to continue stool softeners  Avoid moistened rectal wipes.  Use dry toilet paper only  To treat the yeast infection around your anus, we will prescribe ketoconazole 2% cream dispense 1 unit.  Apply to the external skin only 3 times a day  As discussed with your intermittent rectal bleeding and history of a serrated adenoma, we will go ahead and schedule a colonoscopy in the near future.  Further recommendations to follow.

## 2024-02-14 NOTE — Progress Notes (Signed)
 Primary Care Physician:  Carylon Perches, MD Primary Gastroenterologist:  Dr. Jena Gauss  Pre-Procedure History & Physical: HPI:  [name] [name] is a 63 y.o. male here for follow-up of perianal redness and pain to the internal sphincterotomy and minor hemorrhoidectomy last year by Dr. Lovell Sheehan.  Steadily improving still has intermittent rectal bleeding no longer needing Linzess;  he is going with stool softener and Benefiber only.  History of serrated adenoma removed from his cecum in 2023.  Past Medical History:  Diagnosis Date   Childhood asthma    Diverticulitis    History of kidney stones    Hypercholesteremia    Hypertension    PMR (polymyalgia rheumatica) (HCC)    Prostate cancer [facility])     Past Surgical History:  Procedure Laterality Date   COLONOSCOPY WITH PROPOFOL N/A 04/09/2022   Procedure: COLONOSCOPY WITH PROPOFOL;  Surgeon: Corbin Ade, MD;  [facility];  Service: Endoscopy;  Laterality: N/A;  11:00am   EXTRACORPOREAL SHOCK WAVE LITHOTRIPSY Left 03/14/2021   Procedure: EXTRACORPOREAL SHOCK WAVE LITHOTRIPSY (ESWL);  Surgeon: Malen Gauze, MD;  Location: [facility];  Service: Urology;  Laterality: Left;   FISSURECTOMY N/A 10/02/2023   Procedure: FISSURECTOMY, ANAL;  Surgeon: [name], MD;  Location: [facility];  Service: General;  Laterality: N/A;   HOT HEMOSTASIS  04/09/2022   Procedure: HOT HEMOSTASIS (ARGON PLASMA COAGULATION/BICAP);  Surgeon: Corbin Ade, MD;  [facility];  Service: Endoscopy;;   LEFT HEART CATH AND CORONARY ANGIOGRAPHY N/A 09/20/2021   Procedure: LEFT HEART CATH AND CORONARY ANGIOGRAPHY;  Surgeon: Tonny Bollman, MD;  Location: [facility] INVASIVE CV LAB;  Service: Cardiovascular;  Laterality: N/A;   NECK SURGERY     POLYPECTOMY  04/09/2022   Procedure: POLYPECTOMY;  Surgeon: Corbin Ade, MD;  [facility];  Service: Endoscopy;;   ROBOT ASSISTED LAPAROSCOPIC RADICAL PROSTATECTOMY  2009   SPINE SURGERY      SUBMUCOSAL LIFTING INJECTION  04/09/2022   Procedure: SUBMUCOSAL LIFTING INJECTION;  Surgeon: Corbin Ade, MD;  [facility];  Service: Endoscopy;;    Prior to Admission medications   Medication Sig Start Date End Date Taking? Authorizing Provider  docusate sodium (COLACE) 100 MG capsule Take 100 mg by mouth daily.   Yes [provider]  hydrocortisone 2.5 % cream Apply 1 Application topically daily. 07/15/23  Yes [provider]  olmesartan (BENICAR) 40 MG tablet Take 40 mg by mouth daily. 12/25/21  Yes [provider]  predniSONE (DELTASONE) 1 MG tablet Take 4 mg by mouth daily. 01/14/24  Yes [provider]  Wheat Dextrin (BENEFIBER PO) Take 1 tablet by mouth daily.   Yes [provider]  linaclotide Karlene Einstein) 145 MCG CAPS capsule Take 1 capsule (145 mcg total) by mouth daily before breakfast. Patient not taking: Reported on 02/14/2024 07/30/23   Corbin Ade, MD    Allergies as of 02/14/2024 - Review Complete 02/14/2024  Allergen Reaction Noted   Penicillins     Sulfonamide derivatives     Celebrex [celecoxib] Rash     Family History  Problem Relation Age of Onset   Hypertension Father     Social History   Socioeconomic History   Marital status: Married    Spouse name: Not on file   Number of children: 1   Years of education: Not on file   Highest education level: Not on file  Occupational History   Occupation: Education administrator  Tobacco  Use   Smoking status: Former    Current packs/day: 0.00    Types: Cigarettes    Quit date: 12/03/1979    Years since quitting: 44.2   Smokeless tobacco: Never  Vaping Use   Vaping status: Never Used  Substance and Sexual Activity   Alcohol use: Yes    Alcohol/week: 4.0 standard drinks of alcohol    Types: 4 Cans of beer per week    Comment: couple drinks per week   Drug use: Not Currently   Sexual activity: Yes  Other Topics Concern   Not on file  Social History  Narrative   Not on file   Social Drivers of Health   Financial Resource Strain: Not on file  Food Insecurity: Not on file  Transportation Needs: Not on file  Physical Activity: Not on file  Stress: Not on file  Social Connections: Not on file  Intimate Partner Violence: Not on file    Review of Systems: See HPI, otherwise negative ROS  Physical Exam: BP 128/76 (BP Location: Right Arm, Patient Position: Sitting, Cuff Size: Large)   Pulse 63   Temp 98.1 F (36.7 C) (Oral)   Ht 6' (1.829 m)   Wt 261 lb (118.4 kg)   SpO2 97%   BMI 35.40 kg/m  General:   Alert,  Well-developed, well-nourished, pleasant and cooperative in NAD Lungs:  Clear throughout to auscultation.   No wheezes, crackles, or rhonchi. No acute distress. Heart:  Regular rate and rhythm; no murmurs, clicks, rubs,  or gallops. Abdomen: Non-distended, normal bowel sounds.  Soft and nontender without appreciable mass or hepatosplenomegaly.  Rectal: He has a halo of perirectal redness around his anus.  No anal abnormality seen Impression/Plan: 63 year old gentleman history of serrated adenoma and symptomatic anal fissure status post sphincterotomy and minor hemorrhoidectomy last year.  Steadily improving he still having intermittent rectal bleeding which he is quite concerned about.  Using moisturized wipes likely predisposing him to a perianal yeast infection.  Recommendations:  Agree with stopping Linzess  Continue Benefiber  Okay to continue stool softeners  Avoid moistened rectal wipes.  Use dry toilet paper only  To treat the yeast infection around your anus, we will prescribe ketoconazole 2% cream dispense 1 unit.  Apply to the external skin only 3 times a day  As discussed with  intermittent rectal bleeding and history of a serrated adenoma, we will go ahead and schedule a colonoscopy in the near future.  The risks, benefits, limitations, alternatives and imponderables have been reviewed with the patient.  Questions have been answered. All parties are agreeable.    Further recommendations to follow.  Notice: This dictation was prepared with Dragon dictation along with smaller phrase technology. Any transcriptional errors that result from this process are unintentional and may not be corrected upon review.

## 2024-02-19 ENCOUNTER — Telehealth: Payer: Self-pay | Admitting: *Deleted

## 2024-02-19 MED ORDER — NA SULFATE-K SULFATE-MG SULF 17.5-3.13-1.6 GM/177ML PO SOLN: 1.0000 | ORAL | 0 refills | Status: DC

## 2024-02-19 NOTE — Telephone Encounter (Signed)
 Spoke with pt. He has been scheduled for TCS with Dr. Jena Gauss, ASA 2 on 4/24. Aware will send instructions and rx to the pharmacy.  Checked carelon and no PA required for surgical procedure.

## 2024-02-28 DIAGNOSIS — E785 Hyperlipidemia, unspecified: Secondary | ICD-10-CM | POA: Diagnosis not present

## 2024-02-28 DIAGNOSIS — I10 Essential (primary) hypertension: Secondary | ICD-10-CM | POA: Diagnosis not present

## 2024-02-28 DIAGNOSIS — M353 Polymyalgia rheumatica: Secondary | ICD-10-CM | POA: Diagnosis not present

## 2024-02-28 DIAGNOSIS — J4520 Mild intermittent asthma, uncomplicated: Secondary | ICD-10-CM | POA: Diagnosis not present

## 2024-02-28 DIAGNOSIS — Z79899 Other long term (current) drug therapy: Secondary | ICD-10-CM | POA: Diagnosis not present

## 2024-03-03 DIAGNOSIS — I10 Essential (primary) hypertension: Secondary | ICD-10-CM | POA: Diagnosis not present

## 2024-03-03 DIAGNOSIS — M353 Polymyalgia rheumatica: Secondary | ICD-10-CM | POA: Diagnosis not present

## 2024-03-03 DIAGNOSIS — Z0001 Encounter for general adult medical examination with abnormal findings: Secondary | ICD-10-CM | POA: Diagnosis not present

## 2024-03-03 DIAGNOSIS — Z8546 Personal history of malignant neoplasm of prostate: Secondary | ICD-10-CM | POA: Diagnosis not present

## 2024-03-26 ENCOUNTER — Other Ambulatory Visit (HOSPITAL_COMMUNITY): Payer: Self-pay | Admitting: Internal Medicine

## 2024-03-26 DIAGNOSIS — R931 Abnormal findings on diagnostic imaging of heart and coronary circulation: Secondary | ICD-10-CM

## 2024-03-30 ENCOUNTER — Other Ambulatory Visit: Payer: BC Managed Care – PPO

## 2024-03-30 DIAGNOSIS — C61 Malignant neoplasm of prostate: Secondary | ICD-10-CM

## 2024-03-31 LAB — PSA: Prostate Specific Ag, Serum: <0.1 ng/mL (ref 0.0–4.0)

## 2024-04-06 ENCOUNTER — Ambulatory Visit: Payer: BC Managed Care – PPO | Admitting: Urology

## 2024-04-09 ENCOUNTER — Other Ambulatory Visit: Payer: Self-pay

## 2024-04-09 ENCOUNTER — Ambulatory Visit (HOSPITAL_COMMUNITY): Admitting: Certified Registered"

## 2024-04-09 ENCOUNTER — Encounter (HOSPITAL_COMMUNITY)
Admission: RE | Disposition: A | Discharge status: Home / Self Care | Payer: Self-pay | Source: Home / Self Care | Attending: Internal Medicine

## 2024-04-09 ENCOUNTER — Encounter (HOSPITAL_COMMUNITY): Payer: Self-pay | Admitting: Internal Medicine

## 2024-04-09 ENCOUNTER — Ambulatory Visit (HOSPITAL_COMMUNITY)
Admission: RE | Admit: 2024-04-09 | Discharge: 2024-04-09 | Disposition: A | Discharge status: Home / Self Care | Attending: Internal Medicine | Admitting: Internal Medicine

## 2024-04-09 DIAGNOSIS — I10 Essential (primary) hypertension: Secondary | ICD-10-CM | POA: Diagnosis not present

## 2024-04-09 DIAGNOSIS — Z87891 Personal history of nicotine dependence: Secondary | ICD-10-CM | POA: Diagnosis not present

## 2024-04-09 DIAGNOSIS — D123 Benign neoplasm of transverse colon: Secondary | ICD-10-CM | POA: Insufficient documentation

## 2024-04-09 DIAGNOSIS — J45909 Unspecified asthma, uncomplicated: Secondary | ICD-10-CM | POA: Insufficient documentation

## 2024-04-09 DIAGNOSIS — Z1211 Encounter for screening for malignant neoplasm of colon: Secondary | ICD-10-CM | POA: Diagnosis not present

## 2024-04-09 DIAGNOSIS — Z860101 Personal history of adenomatous and serrated colon polyps: Secondary | ICD-10-CM | POA: Diagnosis not present

## 2024-04-09 DIAGNOSIS — K635 Polyp of colon: Secondary | ICD-10-CM | POA: Diagnosis not present

## 2024-04-09 DIAGNOSIS — K5730 Diverticulosis of large intestine without perforation or abscess without bleeding: Secondary | ICD-10-CM | POA: Diagnosis not present

## 2024-04-09 HISTORY — PX: COLONOSCOPY: SHX5424

## 2024-04-09 SURGERY — COLONOSCOPY
Anesthesia: General

## 2024-04-09 MED ORDER — PROPOFOL 500 MG/50ML IV EMUL
INTRAVENOUS | Status: DC | PRN
  Administered 2024-04-09: 150 ug/kg/min via INTRAVENOUS

## 2024-04-09 MED ORDER — LIDOCAINE 2% (20 MG/ML) 5 ML SYRINGE
INTRAMUSCULAR | Status: AC
  Filled 2024-04-09: qty 5

## 2024-04-09 MED ORDER — STERILE WATER FOR IRRIGATION IR SOLN
Status: DC | PRN
  Administered 2024-04-09: 60 mL

## 2024-04-09 MED ORDER — LIDOCAINE 2% (20 MG/ML) 5 ML SYRINGE
INTRAMUSCULAR | Status: DC | PRN
  Administered 2024-04-09: 60 mg via INTRAVENOUS

## 2024-04-09 MED ORDER — PROPOFOL 1000 MG/100ML IV EMUL
INTRAVENOUS | Status: AC
  Filled 2024-04-09: qty 100

## 2024-04-09 MED ORDER — PROPOFOL 10 MG/ML IV BOLUS
INTRAVENOUS | Status: DC | PRN
  Administered 2024-04-09: 30 mg via INTRAVENOUS
  Administered 2024-04-09: 80 mg via INTRAVENOUS

## 2024-04-09 MED ORDER — LACTATED RINGERS IV SOLN: INTRAVENOUS | Status: DC

## 2024-04-09 NOTE — Anesthesia Preprocedure Evaluation (Signed)
 Anesthesia Evaluation  Patient identified by MRN, date of birth, ID band Patient awake    Reviewed: Allergy & Precautions, H&P , NPO status , Patient's Chart, lab work & pertinent test results, reviewed documented beta blocker date and time   Airway Mallampati: II  TM Distance: >3 FB Neck ROM: full    Dental no notable dental hx.    Pulmonary neg pulmonary ROS, asthma , former smoker   Pulmonary exam normal breath sounds clear to auscultation       Cardiovascular Exercise Tolerance: Good hypertension, + angina  negative cardio ROS  Rhythm:regular Rate:Normal     Neuro/Psych  Neuromuscular disease negative neurological ROS  negative psych ROS   GI/Hepatic negative GI ROS, Neg liver ROS,,,  Endo/Other  negative endocrine ROS    Renal/GU Renal diseasenegative Renal ROS  negative genitourinary   Musculoskeletal   Abdominal   Peds  Hematology negative hematology ROS (+)   Anesthesia Other Findings   Reproductive/Obstetrics negative OB ROS                             Anesthesia Physical Anesthesia Plan  ASA: 2  Anesthesia Plan: General   Post-op Pain Management:    Induction:   PONV Risk Score and Plan: Propofol  infusion  Airway Management Planned:   Additional Equipment:   Intra-op Plan:   Post-operative Plan:   Informed Consent: I have reviewed the patients History and Physical, chart, labs and discussed the procedure including the risks, benefits and alternatives for the proposed anesthesia with the patient or authorized representative who has indicated his/her understanding and acceptance.     Dental Advisory Given  Plan Discussed with: CRNA  Anesthesia Plan Comments:        Anesthesia Quick Evaluation

## 2024-04-09 NOTE — Discharge Instructions (Addendum)
  Colonoscopy Discharge Instructions  Read the instructions outlined below and refer to this sheet in the next few weeks. These discharge instructions provide you with general information on caring for yourself after you leave the hospital. Your doctor may also give you specific instructions. While your treatment has been planned according to the most current medical practices available, unavoidable complications occasionally occur. If you have any problems or questions after discharge, call Dr. Riley Cheadle at [phone]. ACTIVITY You may resume your regular activity, but move at a slower pace for the next 24 hours.  Take frequent rest periods for the next 24 hours.  Walking will help get rid of the air and reduce the bloated feeling in your belly (abdomen).  No driving for 24 hours (because of the medicine (anesthesia) used during the test).   Do not sign any important legal documents or operate any machinery for 24 hours (because of the anesthesia used during the test).  NUTRITION Drink plenty of fluids.  You may resume your normal diet as instructed by your doctor.  Begin with a light meal and progress to your normal diet. Heavy or fried foods are harder to digest and may make you feel sick to your stomach (nauseated).  Avoid alcoholic beverages for 24 hours or as instructed.  MEDICATIONS You may resume your normal medications unless your doctor tells you otherwise.  WHAT YOU CAN EXPECT TODAY Some feelings of bloating in the abdomen.  Passage of more gas than usual.  Spotting of blood in your stool or on the toilet paper.  IF YOU HAD POLYPS REMOVED DURING THE COLONOSCOPY: No aspirin  products for 7 days or as instructed.  No alcohol for 7 days or as instructed.  Eat a soft diet for the next 24 hours.  FINDING OUT THE RESULTS OF YOUR TEST Not all test results are available during your visit. If your test results are not back during the visit, make an appointment with your caregiver to find out the  results. Do not assume everything is normal if you have not heard from your caregiver or the medical facility. It is important for you to follow up on all of your test results.  SEEK IMMEDIATE MEDICAL ATTENTION IF: You have more than a spotting of blood in your stool.  Your belly is swollen (abdominal distention).  You are nauseated or vomiting.  You have a temperature over 101.  You have abdominal pain or discomfort that is severe or gets worse throughout the day.      1 polyp found and removed today  Colon polyp and diverticulosis information provided  No explanation for right lower quadrant abdominal pain found on today's examination  Office visit with us  in 6 weeks for further evaluation.  Office will notify you

## 2024-04-09 NOTE — Op Note (Signed)
 [facility] Patient Name: [name] [name] Procedure Date: 04/09/2024 7:03 AM MRN: [id number] Date of Birth: 1961/08/25 Attending MD: Gemma Kelp , MD, [id number] CSN: [id number] Age: 63 Admit Type: Outpatient Procedure:                Colonoscopy Indications:              High risk colon cancer surveillance: Personal                            history of colonic polyps Providers:                Gemma Kelp, MD, Willena Harp, Theola Fitch Referring MD:              Medicines:                Propofol  per Anesthesia Complications:            No immediate complications. Estimated Blood Loss:     Estimated blood loss was minimal. Procedure:                Pre-Anesthesia Assessment:                           - Prior to the procedure, a History and Physical                            was performed, and patient medications and                            allergies were reviewed. The patient's tolerance of                            previous anesthesia was also reviewed. The risks                            and benefits of the procedure and the sedation                            options and risks were discussed with the patient.                            All questions were answered, and informed consent                            was obtained. Prior Anticoagulants: The patient has                            taken no anticoagulant or antiplatelet agents. ASA                            Grade Assessment: II - A patient with mild systemic  disease. After reviewing the risks and benefits,                            the patient was deemed in satisfactory condition to                            undergo the procedure.                           After obtaining informed consent, the colonoscope                            was passed under direct vision. Throughout the                            procedure, the patient's  blood pressure, pulse, and                            oxygen saturations were monitored continuously. The                            [id number]) scope was introduced through                            the anus and advanced to the 5 cm into the ileum.                            The colonoscopy was performed without difficulty.                            The patient tolerated the procedure well. The                            quality of the bowel preparation was adequate. The                            terminal ileum, ileocecal valve, appendiceal                            orifice, and rectum were photographed. Scope In: 7:38:42 AM Scope Out: 7:53:38 AM Scope Withdrawal Time: 0 hours 8 minutes 50 seconds  Total Procedure Duration: 0 hours 14 minutes 56 seconds  Findings:      The perianal and digital rectal examinations were normal.      A 4 mm polyp was found in the splenic flexure. The polyp was       semi-pedunculated. The polyp was removed with a cold snare. Resection       and retrieval were complete. Estimated blood loss was minimal.      Scattered medium-mouthed diverticula were found in the sigmoid colon and       descending colon.      The exam was otherwise without abnormality on direct and retroflexion       views. Impression:               - One 4 mm polyp at the  splenic flexure, removed                            with a cold snare. Resected and retrieved.                           - Diverticulosis in the sigmoid colon and in the                            descending colon.                           - The examination was otherwise normal on direct                            and retroflexion views.                           - Moderate Sedation:      Moderate (conscious) sedation was personally administered by an       anesthesia professional. The following parameters were monitored: oxygen       saturation, heart rate, blood pressure, respiratory rate, EKG, adequacy        of pulmonary ventilation, and response to care. Recommendation:           - Patient has a contact number available for                            emergencies. The signs and symptoms of potential                            delayed complications were discussed with the                            patient. Return to normal activities tomorrow.                            Written discharge instructions were provided to the                            patient.                           - Advance diet as tolerated.                           - Continue present medications.                           - Repeat colonoscopy date to be determined after                            pending pathology results are reviewed for                            surveillance.                           -  Return to GI office in 6 weeks. Procedure Code(s):        --- Professional ---                           [phone], Colonoscopy, flexible; with removal of                            tumor(s), polyp(s), or other lesion(s) by snare                            technique Diagnosis Code(s):        --- Professional ---                           Z86.010, Personal history of colonic polyps                           D12.3, Benign neoplasm of transverse colon (hepatic                            flexure or splenic flexure)                           K57.30, Diverticulosis of large intestine without                            perforation or abscess without bleeding CPT copyright 2022 [employer]. All rights reserved. The codes documented in this report are preliminary and upon coder review may  be revised to meet current compliance requirements. Windsor Hatcher. Yetzali Weld, MD Gemma Kelp, MD 04/09/2024 8:06:10 AM This report has been signed electronically. Number of Addenda: 0

## 2024-04-09 NOTE — Transfer of Care (Signed)
 Immediate Anesthesia Transfer of Care Note  Patient: [name] [name]  Procedure(s) Performed: COLONOSCOPY  Patient Location: Endoscopy Unit  Anesthesia Type:General  Level of Consciousness: drowsy and patient cooperative  Airway & Oxygen Therapy: Patient Spontanous Breathing and Patient connected to face mask oxygen  Post-op Assessment: Report given to RN and Post -op Vital signs reviewed and stable  Post vital signs: Reviewed and stable  Last Vitals:  Vitals Value Taken Time  BP 101/57 04/09/24 0758  Temp 36.8 C 04/09/24 0758  Pulse 82 04/09/24 0758  Resp 16 04/09/24 0758  SpO2 98 % 04/09/24 0758    Last Pain:  Vitals:   04/09/24 0758  TempSrc: Oral  PainSc: 0-No pain      Patients Stated Pain Goal: 6 (04/09/24 0654)  Complications: No notable events documented.

## 2024-04-09 NOTE — H&P (Signed)
 @LOGO @   Primary Care Physician:  Artemisa Bile, MD Primary Gastroenterologist:  Dr. Riley Cheadle  Pre-Procedure History & Physical: HPI:  [name] [name] is a 63 y.o. male here for surveillance colonoscopy history of greater than 1 cm serrated adenoma removed from his cecum 3 years ago today.  Here for updated colonoscopy.  Past Medical History:  Diagnosis Date   Childhood asthma    Diverticulitis    History of kidney stones    Hypercholesteremia    Hypertension    PMR (polymyalgia rheumatica) (HCC)    Prostate cancer [facility])     Past Surgical History:  Procedure Laterality Date   COLONOSCOPY WITH PROPOFOL  N/A 04/09/2022   Procedure: COLONOSCOPY WITH PROPOFOL ;  Surgeon: Suzette Espy, MD;  Location: [facility];  Service: Endoscopy;  Laterality: N/A;  11:00am   EXTRACORPOREAL SHOCK WAVE LITHOTRIPSY Left 03/14/2021   Procedure: EXTRACORPOREAL SHOCK WAVE LITHOTRIPSY (ESWL);  Surgeon: Marco Severs, MD;  Location: [facility] ORS;  Service: Urology;  Laterality: Left;   FISSURECTOMY N/A 10/02/2023   Procedure: FISSURECTOMY, ANAL;  Surgeon: Alanda Allegra, MD;  Location: [facility] ORS;  Service: General;  Laterality: N/A;   HOT HEMOSTASIS  04/09/2022   Procedure: HOT HEMOSTASIS (ARGON PLASMA COAGULATION/BICAP);  Surgeon: Suzette Espy, MD;  Location: [facility];  Service: Endoscopy;;   LEFT HEART CATH AND CORONARY ANGIOGRAPHY N/A 09/20/2021   Procedure: LEFT HEART CATH AND CORONARY ANGIOGRAPHY;  Surgeon: Arnoldo Lapping, MD;  Location: [facility] INVASIVE CV LAB;  Service: Cardiovascular;  Laterality: N/A;   NECK SURGERY     POLYPECTOMY  04/09/2022   Procedure: POLYPECTOMY;  Surgeon: Suzette Espy, MD;  [facility];  Service: Endoscopy;;   ROBOT ASSISTED LAPAROSCOPIC RADICAL PROSTATECTOMY  2009   SPINE SURGERY     SUBMUCOSAL LIFTING INJECTION  04/09/2022   Procedure: SUBMUCOSAL LIFTING INJECTION;  Surgeon: Suzette Espy, MD;  [facility];  Service: Endoscopy;;    Prior to  Admission medications   Medication Sig Start Date End Date Taking? Authorizing Provider  docusate sodium (COLACE) 100 MG capsule Take 100 mg by mouth daily.   Yes [provider]  hydrocortisone 2.5 % cream Apply 1 Application topically daily. 07/15/23  Yes [provider]  ketoconazole  (NIZORAL ) 2 % cream Apply 1 Application topically 3 (three) times daily. 02/14/24  Yes Cobie Leidner, Windsor Hatcher, MD  olmesartan (BENICAR) 40 MG tablet Take 40 mg by mouth daily. 12/25/21  Yes [provider]  predniSONE  (DELTASONE ) 1 MG tablet Take 4 mg by mouth daily. 01/14/24  Yes [provider]  Wheat Dextrin (BENEFIBER PO) Take 1 tablet by mouth daily.   Yes [provider]  linaclotide  (LINZESS ) 145 MCG CAPS capsule Take 1 capsule (145 mcg total) by mouth daily before breakfast. Patient not taking: Reported on 02/14/2024 07/30/23   [name], Windsor Hatcher, MD  Na Sulfate-K Sulfate-Mg Sulfate concentrate (SUPREP) 17.5-3.13-1.6 GM/177ML SOLN Take 1 kit (354 mLs total) by mouth as directed. 02/19/24   Suzette Espy, MD    Allergies as of 02/19/2024 - Review Complete 02/14/2024  Allergen Reaction Noted   Penicillins     Sulfonamide derivatives     Celebrex [celecoxib] Rash     Family History  Problem Relation Age of Onset   Hypertension Father     Social History   Socioeconomic History   Marital status: Married    Spouse name: Not on file   Number of children: 1   Years of education:  Not on file   Highest education level: Not on file  Occupational History   Occupation: Education administrator  Tobacco Use   Smoking status: Former    Current packs/day: 0.00    Types: Cigarettes    Quit date: 12/03/1979    Years since quitting: 44.3   Smokeless tobacco: Never  Vaping Use   Vaping status: Never Used  Substance and Sexual Activity   Alcohol use: Yes    Alcohol/week: 4.0 standard drinks of alcohol    Types: 4 Cans of beer per week    Comment: couple drinks per week    Drug use: Not Currently   Sexual activity: Yes  Other Topics Concern   Not on file  Social History Narrative   Not on file   Social Drivers of Health   Financial Resource Strain: Not on file  Food Insecurity: Not on file  Transportation Needs: Not on file  Physical Activity: Not on file  Stress: Not on file  Social Connections: Not on file  Intimate Partner Violence: Not on file    Review of Systems: See HPI, otherwise negative ROS  Physical Exam: BP 136/77   Pulse 64   Temp 98.2 F (36.8 C) (Oral)   Resp 16   Ht 6' (1.829 m)   Wt 113.4 kg   SpO2 96%   BMI 33.91 kg/m  General:   Alert,  Well-developed, well-nourished, pleasant and cooperative in NAD Neck:  Supple; no masses or thyromegaly. No significant cervical adenopathy. Lungs:  Clear throughout to auscultation.   No wheezes, crackles, or rhonchi. No acute distress. Heart:  Regular rate and rhythm; no murmurs, clicks, rubs,  or gallops. Abdomen: Non-distended, normal bowel sounds.  Soft and nontender without appreciable mass or hepatosplenomegaly.   Impression/Plan: 63 year old gentleman here for surveillance colonoscopy.  History of serrated adenoma removed 3 years ago. The risks, benefits, limitations, alternatives and imponderables have been reviewed with the patient. Questions have been answered. All parties are agreeable.       Notice: This dictation was prepared with Dragon dictation along with smaller phrase technology. Any transcriptional errors that result from this process are unintentional and may not be corrected upon review.

## 2024-04-10 ENCOUNTER — Encounter (HOSPITAL_COMMUNITY): Payer: Self-pay | Admitting: Internal Medicine

## 2024-04-10 NOTE — Anesthesia Postprocedure Evaluation (Signed)
 Anesthesia Post Note  Patient: [name] [name]  Procedure(s) Performed: COLONOSCOPY  Patient location during evaluation: Phase II Anesthesia Type: General Level of consciousness: awake Pain management: pain level controlled Vital Signs Assessment: post-procedure vital signs reviewed and stable Respiratory status: spontaneous breathing and respiratory function stable Cardiovascular status: blood pressure returned to baseline and stable Postop Assessment: no headache and no apparent nausea or vomiting Anesthetic complications: no Comments: Late entry   No notable events documented.   Last Vitals:  Vitals:   04/09/24 0654 04/09/24 0758  BP: 136/77 (!) 101/57  Pulse: 64 82  Resp: 16 16  Temp: 36.8 C 36.8 C  SpO2: 96% 98%    Last Pain:  Vitals:   04/09/24 0758  TempSrc: Oral  PainSc: 0-No pain                 Coretha Dew

## 2024-04-13 LAB — SURGICAL PATHOLOGY

## 2024-04-15 ENCOUNTER — Ambulatory Visit (HOSPITAL_COMMUNITY)
Admission: RE | Admit: 2024-04-15 | Discharge: 2024-04-15 | Disposition: A | Discharge status: Home / Self Care | Source: Ambulatory Visit | Attending: Urology | Admitting: Urology

## 2024-04-15 ENCOUNTER — Other Ambulatory Visit: Payer: Self-pay

## 2024-04-15 DIAGNOSIS — N200 Calculus of kidney: Secondary | ICD-10-CM | POA: Insufficient documentation

## 2024-04-15 DIAGNOSIS — I878 Other specified disorders of veins: Secondary | ICD-10-CM | POA: Diagnosis not present

## 2024-04-17 ENCOUNTER — Encounter: Payer: Self-pay | Admitting: Internal Medicine

## 2024-04-17 ENCOUNTER — Ambulatory Visit (HOSPITAL_COMMUNITY)
Admission: RE | Admit: 2024-04-17 | Discharge: 2024-04-17 | Disposition: A | Discharge status: Home / Self Care | Payer: Self-pay | Source: Ambulatory Visit | Attending: Internal Medicine | Admitting: Internal Medicine

## 2024-04-17 ENCOUNTER — Encounter: Payer: Self-pay | Admitting: Urology

## 2024-04-17 ENCOUNTER — Ambulatory Visit (INDEPENDENT_AMBULATORY_CARE_PROVIDER_SITE_OTHER): Payer: BC Managed Care – PPO | Admitting: Urology

## 2024-04-17 VITALS — BP 113/64 | HR 70

## 2024-04-17 DIAGNOSIS — N200 Calculus of kidney: Secondary | ICD-10-CM | POA: Diagnosis not present

## 2024-04-17 DIAGNOSIS — Z8546 Personal history of malignant neoplasm of prostate: Secondary | ICD-10-CM

## 2024-04-17 DIAGNOSIS — R931 Abnormal findings on diagnostic imaging of heart and coronary circulation: Secondary | ICD-10-CM | POA: Insufficient documentation

## 2024-04-17 DIAGNOSIS — C61 Malignant neoplasm of prostate: Secondary | ICD-10-CM

## 2024-04-17 LAB — URINALYSIS, ROUTINE W REFLEX MICROSCOPIC
Bilirubin, UA: NEGATIVE
Glucose, UA: NEGATIVE
Ketones, UA: NEGATIVE
Leukocytes,UA: NEGATIVE
Nitrite, UA: NEGATIVE
Protein,UA: NEGATIVE
RBC, UA: NEGATIVE
Specific Gravity, UA: 1.01 (ref 1.005–1.030)
Urobilinogen, Ur: 0.2 mg/dL (ref 0.2–1.0)
pH, UA: 6 (ref 5.0–7.5)

## 2024-04-17 NOTE — Patient Instructions (Signed)

## 2024-04-17 NOTE — Progress Notes (Signed)
 04/17/2024 8:36 AM   [name] [name] [birth year] [id number]  Referring provider: Artemisa Bile, MD [address] [place],  [place] [address]  Followup nephrolithiasis   HPI: [name] is a 63yo here for followup for prostate cancer and nephrolithiasis. No stone events since last visit. KUB 04/15/24 shows a possible 3mm left lower pole calculus. No stone events since last visit. PSA remains undetectable. No significant LUTS. IPSS 3 QOL 2. He has mild SUI after prostatectomy.    PMH: Past Medical History:  Diagnosis Date   Childhood asthma    Diverticulitis    History of kidney stones    Hypercholesteremia    Hypertension    PMR (polymyalgia rheumatica) (HCC)    Prostate cancer [facility])     Surgical History: Past Surgical History:  Procedure Laterality Date   COLONOSCOPY N/A 04/09/2024   Procedure: COLONOSCOPY;  Surgeon: [name] Espy, MD;  [facility];  Service: Endoscopy;  Laterality: N/A;  730AM, ASA 2   COLONOSCOPY WITH PROPOFOL  N/A 04/09/2022   Procedure: COLONOSCOPY WITH PROPOFOL ;  Surgeon: [name] Espy, MD;  [facility];  Service: Endoscopy;  Laterality: N/A;  11:00am   EXTRACORPOREAL SHOCK WAVE LITHOTRIPSY Left 03/14/2021   Procedure: EXTRACORPOREAL SHOCK WAVE LITHOTRIPSY (ESWL);  Surgeon: [name] Severs, MD;  Location: [facility];  Service: Urology;  Laterality: Left;   FISSURECTOMY N/A 10/02/2023   Procedure: FISSURECTOMY, ANAL;  Surgeon: [name] Allegra, MD;  Location: [facility] OR[facility];  Service: General;  Laterality: N/A;   HOT HEMOSTASIS  04/09/2022   Procedure: HOT HEMOSTASIS (ARGON PLASMA COAGULATION/BICAP);  Surgeon: [name] Espy, MD;  [facility];  Service: Endoscopy;;   LEFT HEART CATH AND CORONARY ANGIOGRAPHY N/A 09/20/2021   Procedure: LEFT HEART CATH AND CORONARY ANGIOGRAPHY;  Surgeon: [name] Lapping, MD;  Location: [facility] INVASIVE CV LAB;  Service: Cardiovascular;  Laterality: N/A;   NECK SURGERY     POLYPECTOMY  04/09/2022    Procedure: POLYPECTOMY;  Surgeon: [name] Espy, MD;  [facility];  Service: Endoscopy;;   ROBOT ASSISTED LAPAROSCOPIC RADICAL PROSTATECTOMY  2009   SPINE SURGERY     SUBMUCOSAL LIFTING INJECTION  04/09/2022   Procedure: SUBMUCOSAL LIFTING INJECTION;  Surgeon: [name] Espy, MD;  [facility];  Service: Endoscopy;;    Home Medications:  Allergies as of 04/17/2024       Reactions   Penicillins    Childhood allergy   Sulfonamide Derivatives    Pt does not remember reaction    Celebrex [celecoxib] Rash        Medication List        Accurate as of Apr 17, 2024  8:36 AM. If you have any questions, ask your nurse or doctor.          BENEFIBER PO Take 1 tablet by mouth daily.   docusate sodium 100 MG capsule Commonly known as: COLACE Take 100 mg by mouth daily.   hydrocortisone 2.5 % cream Apply 1 Application topically daily.   ketoconazole  2 % cream Commonly known as: NIZORAL  Apply 1 Application topically 3 (three) times daily.   linaclotide  145 MCG Caps capsule Commonly known as: Linzess  Take 1 capsule (145 mcg total) by mouth daily before breakfast.   Na Sulfate-K Sulfate-Mg Sulfate concentrate 17.5-3.13-1.6 GM/177ML Soln Commonly known as: SUPREP Take 1 kit (354 mLs total) by mouth as directed.   olmesartan 40 MG tablet Commonly known as: BENICAR Take 40 mg by mouth daily.  predniSONE  1 MG tablet Commonly known as: DELTASONE  Take 4 mg by mouth daily.        Allergies:  Allergies  Allergen Reactions   Penicillins     Childhood allergy   Sulfonamide Derivatives     Pt does not remember reaction    Celebrex [Celecoxib] Rash    Family History: Family History  Problem Relation Age of Onset   Hypertension Father     Social History:  reports that he quit smoking about 44 years ago. His smoking use included cigarettes. He has never used smokeless tobacco. He reports current alcohol use of about 4.0 standard drinks of alcohol  per week. He reports that he does not currently use drugs.  ROS: All other review of systems were reviewed and are negative except what is noted above in HPI  Physical Exam: BP 113/64   Pulse 70   Constitutional:  Alert and oriented, No acute distress. HEENT: [place] AT, moist mucus membranes.  Trachea midline, no masses. Cardiovascular: No clubbing, cyanosis, or edema. Respiratory: Normal respiratory effort, no increased work of breathing. GI: Abdomen is soft, nontender, nondistended, no abdominal masses GU: No CVA tenderness.  Lymph: No cervical or inguinal lymphadenopathy. Skin: No rashes, bruises or suspicious lesions. Neurologic: Grossly intact, no focal deficits, moving all 4 extremities. Psychiatric: Normal mood and affect.  Laboratory Data: Lab Results  Component Value Date   WBC 6.4 09/20/2021   HGB 14.9 09/20/2021   HCT 45.1 09/20/2021   MCV 89.1 09/20/2021   PLT 180 09/20/2021    Lab Results  Component Value Date   CREATININE 1.13 09/20/2021    No results found for: "PSA"  No results found for: "TESTOSTERONE"  No results found for: "HGBA1C"  Urinalysis    Component Value Date/Time   COLORURINE YELLOW 02/28/2021 0631   APPEARANCEUR Clear 04/09/2023 1337   LABSPEC 1.023 02/28/2021 0631   PHURINE 5.0 02/28/2021 0631   GLUCOSEU Negative 04/09/2023 1337   HGBUR NEGATIVE 02/28/2021 0631   BILIRUBINUR Negative 04/09/2023 1337   KETONESUR 5 (A) 02/28/2021 0631   PROTEINUR Negative 04/09/2023 1337   PROTEINUR NEGATIVE 02/28/2021 0631   UROBILINOGEN 0.2 05/28/2011 0746   NITRITE Negative 04/09/2023 1337   NITRITE NEGATIVE 02/28/2021 0631   LEUKOCYTESUR Negative 04/09/2023 1337   LEUKOCYTESUR TRACE (A) 02/28/2021 0631    Lab Results  Component Value Date   LABMICR Comment 04/09/2023   WBCUA None seen 03/20/2021   LABEPIT None seen 03/20/2021   MUCUS Present 03/01/2021   BACTERIA None seen 03/20/2021    Pertinent Imaging: KUb 04/15/2024: Images reviewed  and discussed with the patient  Results for orders placed in visit on 04/15/24  DG Abd 1 View  Narrative CLINICAL DATA:  Kidney stone.  EXAM: ABDOMEN - 1 VIEW  COMPARISON:  Radiograph 11/26/2022  FINDINGS: Potential 3 mm left lower pole renal calculus. Multiple calcifications in the pelvis are stable from prior in typical of phleboliths. Moderate colonic stool burden without obstruction.  IMPRESSION: Potential 3 mm left lower pole renal calculus.   Electronically Signed By: Chadwick Colonel M.D. On: 04/15/2024 11:45  No results found for this or any previous visit.  No results found for this or any previous visit.  No results found for this or any previous visit.  No results found for this or any previous visit.  No results found for this or any previous visit.  No results found for this or any previous visit.  Results for orders placed during the hospital  encounter of 02/28/21  CT Renal Stone Study  Narrative CLINICAL DATA:  Flank pain.  Kidney stone suspected.  EXAM: CT ABDOMEN AND PELVIS WITHOUT CONTRAST  TECHNIQUE: Multidetector CT imaging of the abdomen and pelvis was performed following the standard protocol without IV contrast.  COMPARISON:  CT angio chest 04/10/2019, CT abdomen pelvis 05/28/2011  FINDINGS: Lower chest: No acute abnormality.  Hepatobiliary: No focal liver abnormality. No gallstones, gallbladder wall thickening, or pericholecystic fluid. No biliary dilatation.  Pancreas: No focal lesion. Normal pancreatic contour. No surrounding inflammatory changes. No main pancreatic ductal dilatation.  Spleen: Normal in size without focal abnormality.  Adrenals/Urinary Tract: No adrenal nodule bilaterally. 1-2 mm calcified stones within the right kidney. 1-2 mm calcified stone within the left kidney. There is a 7 mm calcified stone at the left ureteropelvic junction with associated proximal mild hydronephrosis. No right ureterolithiasis.  No right hydronephrosis. No contour-deforming renal mass. No ureterolithiasis or hydroureter. The urinary bladder is decompressed with trace perivesicular fat stranding.  Stomach/Bowel: Stomach is within normal limits. No evidence of bowel wall thickening or dilatation. Appendix appears normal.  Vascular/Lymphatic: No abdominal aorta or iliac aneurysm. Mild atherosclerotic plaque. Prominent but nonenlarged abdomen lymph nodes. No abdominal, pelvic, or inguinal lymphadenopathy.  Reproductive: The prostate is not definitely identified.  Other: Trace free simple pelvic fluid. No intraperitoneal free gas. No organized fluid collection.  Musculoskeletal:  No abdominal wall hernia or abnormality.  No suspicious lytic or blastic osseous lesions. No acute displaced fracture.  IMPRESSION: 1. Obstructive 7 mm left ureteropelvic junction stone. Correlate with urinalysis for superimposed infection. 2. Nonobstructive bilateral 1-2 mm nephrolithiasis.   Electronically Signed By: Morgane  Naveau M.D. On: 02/28/2021 07:03   Assessment & Plan:    1. Nephrolithiasis (Primary) Followup 2 years with KUB - Urinalysis, Routine w reflex microscopic  2. Prostate cancer [facility]) -Patient is 17 years from RALP and does not require annual PSAs   No follow-ups on file.  Johnie Nailer, MD  [facility] [place]

## 2024-05-12 ENCOUNTER — Ambulatory Visit (INDEPENDENT_AMBULATORY_CARE_PROVIDER_SITE_OTHER): Admitting: Gastroenterology

## 2024-05-12 ENCOUNTER — Encounter: Payer: Self-pay | Admitting: Gastroenterology

## 2024-05-12 VITALS — BP 124/74 | HR 58 | Temp 98.4°F | Ht 72.0 in | Wt 262.0 lb

## 2024-05-12 DIAGNOSIS — L290 Pruritus ani: Secondary | ICD-10-CM | POA: Diagnosis not present

## 2024-05-12 DIAGNOSIS — K6289 Other specified diseases of anus and rectum: Secondary | ICD-10-CM | POA: Diagnosis not present

## 2024-05-12 DIAGNOSIS — R21 Rash and other nonspecific skin eruption: Secondary | ICD-10-CM | POA: Insufficient documentation

## 2024-05-12 MED ORDER — KETOCONAZOLE 2 % EX CREA: 1.0000 | TOPICAL_CREAM | Freq: Two times a day (BID) | CUTANEOUS | 0 refills | Status: DC

## 2024-05-12 NOTE — Patient Instructions (Signed)
 Continue your bowel regimen to maintain soft regular stools.  I would like for you to stop using the [place] [facility] cream for now. Your perianal area appears to have fungal infection from staying moist and sometimes the steroid cream can worsen the infection.   Use ketoconazole , thin layer of cream twice daily for several weeks, at least for 3 days after rash has resolved.   Please call in 2-3 weeks and let me know if your symptoms aren't improving.

## 2024-05-12 NOTE — Progress Notes (Signed)
 GI Office Note    Referring Provider: Artemisa Bile, MD Primary Care Physician:  Artemisa Bile, MD  Primary Gastroenterologist: Rheba Cedar, MD   Chief Complaint   Chief Complaint  Patient presents with   Follow-up    Still having issues with internal hemorrhoids, itching and irritation    History of Present Illness   [name] [name] is a 63 y.o. male presenting today for follow up.   BM regular. Eating salad 3 times per week, increased grains, increased water . Benefiber 2 tbsp every morning. Taking colace, 3 at night. BM 1-2 times per day, soft stool. Very seldom has straining. Does not feel he would have a BM without this regimen. No longer needs Linzess .  Continues to use [place] [facility] hemorrhoid cream (with lidocaine  and cardizem) anorectally twice daily. Does not really notice pain with having BM. Continues to have some brbpr once every 1-2 weeks. Can be on tissue or mixed in stool. Continues to have perianal rash and itching. Notices pain in perianal area with sitting. Uses ketoconazole  twice daily, helps with itch.    Medications   Current Outpatient Medications  Medication Sig Dispense Refill   docusate sodium (COLACE) 100 MG capsule Take 100 mg by mouth daily.     fluticasone (FLONASE) 50 MCG/ACT nasal spray Place 2 sprays into both nostrils daily.     hydrocortisone 2.5 % cream Apply 1 Application topically daily.     ketoconazole  (NIZORAL ) 2 % cream Apply 1 Application topically 3 (three) times daily. 60 g 0   olmesartan (BENICAR) 40 MG tablet Take 40 mg by mouth daily.     predniSONE  (DELTASONE ) 1 MG tablet Take 4 mg by mouth daily.     Wheat Dextrin (BENEFIBER PO) Take 1 tablet by mouth daily.     No current facility-administered medications for this visit.    Allergies   Allergies as of 05/12/2024 - Review Complete 05/12/2024  Allergen Reaction Noted   Penicillins     Sulfonamide derivatives     Celebrex [celecoxib] Rash       Review of Systems    General: Negative for anorexia, weight loss, fever, chills, fatigue, weakness. ENT: Negative for hoarseness, difficulty swallowing , nasal congestion. CV: Negative for chest pain, angina, palpitations, dyspnea on exertion, peripheral edema.  Respiratory: Negative for dyspnea at rest, dyspnea on exertion, cough, sputum, wheezing.  GI: See history of present illness. GU:  Negative for dysuria, hematuria, urinary incontinence, urinary frequency, nocturnal urination.  Endo: Negative for unusual weight change.     Physical Exam   BP 124/74 (BP Location: Right Arm, Patient Position: Sitting, Cuff Size: Large)   Pulse (!) 58   Temp 98.4 F (36.9 C) (Oral)   Ht 6' (1.829 m)   Wt 262 lb (118.8 kg)   SpO2 99%   BMI 35.53 kg/m    General: Well-nourished, well-developed in no acute distress.  Rectal: exam limited to external exam mostly. perianal demarcated rash noted extending circumferentially at least two inches from the anal opening. Anal tags noted. Significant amount of seepage of white cream around anal opening, likely medication. Palpated around anal opening to try and locate source of his pain, minimal insertion of digit did not reproduce pain Extremities: No lower extremity edema. No clubbing or deformities. Neuro: Alert and oriented x 4   Skin: Warm and dry, no jaundice.   Psych: Alert and cooperative, normal mood and affect.  Labs   No recent labs  Imaging Studies  CT CARDIAC SCORING (SELF PAY ONLY) Result Date: 04/23/2024 EXAM: OVER-READ INTERPRETATION CT CHEST The following report is an over-read performed by radiologist Dr. Edrie Gower of [facility] on 04/23/2024 5:52 PM CDT. This over-read does not include interpretation of cardiac or coronary anatomy or pathology. The coronary calcium  score interpretation by the cardiologist is attached. COMPARISON: None. FINDINGS: Vascular: No aortic atherosclerosis. The included aorta is normal in caliber. Mediastinum/nodes:  No adenopathy or mass. Lungs: No mass or consolidation. No focal airspace disease. No suspicious or worrisome pulmonary nodule. No pleural fluid. The included airways are patent. Upper abdomen: Normal Musculoskeletal: No acute or suspicious osseous abnormalities. IMPRESSION: 1.  No acute process is identified in the imaged portions of the chest. Electronically signed by: Edrie Gower MD 04/23/2024 06:52 PM EDT RP Workstation: [id number]   DG Abd 1 View Result Date: 04/15/2024 CLINICAL DATA:  Kidney stone. EXAM: ABDOMEN - 1 VIEW COMPARISON:  Radiograph 11/26/2022 FINDINGS: Potential 3 mm left lower pole renal calculus. Multiple calcifications in the pelvis are stable from prior in typical of phleboliths. Moderate colonic stool burden without obstruction. IMPRESSION: Potential 3 mm left lower pole renal calculus. Electronically Signed   By: Chadwick Colonel M.D.   On: 04/15/2024 11:45    Assessment/Plan:   Perianal pain/itching: ongoing pain s/p sphincterotomy for symptomatic anal fissure. He feels his pain is coming from external discomfort. No pain with BM. His current symptoms may be solely from perianal rash/yeast infection. There is a lot of moisture noted on exam in part due to use of [facility] [facility] cream anorectally with seepage noted on exam today.  -stop [place] [facility] cream -use ketoconazole  2% cream perianally (thin layer), twice daily, if not better in 2 weeks, he will reach out -need to keep area dry, recommend using cotton underwear instead of nylon, gently cleanse area with dry toilet paper several times daily  -continue daily bowel regimen to avoid hard stools/constipation  Trudie Fuse. Harles Lied, MHS, PA-C [facility]

## 2024-05-20 ENCOUNTER — Ambulatory Visit: Admitting: Gastroenterology

## 2024-05-30 DIAGNOSIS — J22 Unspecified acute lower respiratory infection: Secondary | ICD-10-CM | POA: Diagnosis not present

## 2024-05-30 DIAGNOSIS — Z6832 Body mass index (BMI) 32.0-32.9, adult: Secondary | ICD-10-CM | POA: Diagnosis not present

## 2024-05-30 DIAGNOSIS — R059 Cough, unspecified: Secondary | ICD-10-CM | POA: Diagnosis not present

## 2024-05-30 DIAGNOSIS — R030 Elevated blood-pressure reading, without diagnosis of hypertension: Secondary | ICD-10-CM | POA: Diagnosis not present

## 2024-06-30 ENCOUNTER — Ambulatory Visit (HOSPITAL_COMMUNITY)
Admission: RE | Admit: 2024-06-30 | Discharge: 2024-06-30 | Disposition: A | Discharge status: Home / Self Care | Source: Ambulatory Visit | Attending: Internal Medicine | Admitting: Internal Medicine

## 2024-06-30 ENCOUNTER — Other Ambulatory Visit (HOSPITAL_COMMUNITY): Payer: Self-pay | Admitting: Internal Medicine

## 2024-06-30 DIAGNOSIS — R059 Cough, unspecified: Secondary | ICD-10-CM | POA: Diagnosis not present

## 2024-06-30 DIAGNOSIS — J189 Pneumonia, unspecified organism: Secondary | ICD-10-CM | POA: Diagnosis not present

## 2024-06-30 DIAGNOSIS — Z981 Arthrodesis status: Secondary | ICD-10-CM | POA: Diagnosis not present

## 2024-06-30 DIAGNOSIS — R0602 Shortness of breath: Secondary | ICD-10-CM | POA: Diagnosis not present

## 2024-08-26 ENCOUNTER — Telehealth (INDEPENDENT_AMBULATORY_CARE_PROVIDER_SITE_OTHER): Payer: Self-pay

## 2024-08-26 NOTE — Telephone Encounter (Signed)
 Erroneous

## 2024-09-08 ENCOUNTER — Encounter: Payer: Self-pay | Admitting: Internal Medicine

## 2024-09-08 ENCOUNTER — Ambulatory Visit: Admitting: Internal Medicine

## 2024-09-08 VITALS — BP 116/74 | HR 64 | Temp 98.6°F | Ht 72.0 in | Wt 257.4 lb

## 2024-09-08 DIAGNOSIS — K6289 Other specified diseases of anus and rectum: Secondary | ICD-10-CM | POA: Diagnosis not present

## 2024-09-08 NOTE — Patient Instructions (Signed)
 Nice to see you again today  For the time being, no change in your medical regimen for your anal rectal pain.  As discussed, I will reach out to Dr. Mavis and we will come up with recommendations in the near future.

## 2024-09-08 NOTE — Progress Notes (Unsigned)
 Gastroenterology Progress Note    Primary Care Physician:  Sheryle Carwin, MD Primary Gastroenterologist:  Dr. Shaaron  Pre-Procedure History & Physical: HPI:  [name] [name] is a 63 y.o. male here for follow-up anorectal pain history of internal sphincterotomy with Dr. Mavis colonoscopy earlier this year demonstrated no significant anorectal abnormalities; small adenoma removed perianal fungal skin infection treated with ketoconazole  he states his big issue is pain in the anal canal when he has a bowel movement occasional bleeding has significant pain after a bowel movement it is on the left side as he describes.  Worse when he sits and stands pretty much any position.  He takes a laxative to have a bowel movement he tries to avoid having a bowel movement as he knows he is going to have severe pain.  Past Medical History:  Diagnosis Date   Childhood asthma    Diverticulitis    History of kidney stones    Hypercholesteremia    Hypertension    PMR (polymyalgia rheumatica)    Prostate cancer [facility])     Past Surgical History:  Procedure Laterality Date   COLONOSCOPY N/A 04/09/2024   Procedure: COLONOSCOPY;  Surgeon: Shaaron Lamar HERO, MD;  Location: [facility];  Service: Endoscopy;  Laterality: N/A;  730AM, ASA 2   COLONOSCOPY WITH PROPOFOL  N/A 04/09/2022   Procedure: COLONOSCOPY WITH PROPOFOL ;  Surgeon: Shaaron Lamar HERO, MD;  Location: [facility];  Service: Endoscopy;  Laterality: N/A;  11:00am   EXTRACORPOREAL SHOCK WAVE LITHOTRIPSY Left 03/14/2021   Procedure: EXTRACORPOREAL SHOCK WAVE LITHOTRIPSY (ESWL);  Surgeon: Sherrilee Belvie CROME, MD;  Location: [facility];  Service: Urology;  Laterality: Left;   FISSURECTOMY N/A 10/02/2023   Procedure: FISSURECTOMY, ANAL;  Surgeon: Mavis Anes, MD;  Location: [facility];  Service: General;  Laterality: N/A;   HOT HEMOSTASIS  04/09/2022   Procedure: HOT HEMOSTASIS (ARGON PLASMA COAGULATION/BICAP);  Surgeon: Shaaron Lamar HERO, MD;  [facility];  Service: Endoscopy;;   LEFT HEART CATH AND CORONARY ANGIOGRAPHY N/A 09/20/2021   Procedure: LEFT HEART CATH AND CORONARY ANGIOGRAPHY;  Surgeon: Wonda Sharper, MD;  Location: [facility] IN[facility]IVE CV [facility];  Service: Cardiovascular;  Laterality: N/A;   NECK SURGERY     POLYPECTOMY  04/09/2022   Procedure: POLYPECTOMY;  Surgeon: Shaaron Lamar HERO, MD;  [facility]: [facility];  Service: Endoscopy;;   ROBOT ASSISTED LAPAROSCOPIC RADICAL PROSTATECTOMY  2009   SPINE SURGERY     SUBMUCOSAL LIFTING INJECTION  04/09/2022   Procedure: SUBMUCOSAL LIFTING INJECTION;  Surgeon: Shaaron Lamar HERO, MD;  [facility]: [facility];  Service: Endoscopy;;    Prior to Admission medications   Medication Sig Start Date End Date Taking? Authorizing Provider  docusate sodium (COLACE) 100 MG capsule Take 100 mg by mouth daily.   Yes [provider]  hydrocortisone 2.5 % cream Apply 1 Application topically daily. 07/15/23  Yes [provider]  olmesartan (BENICAR) 40 MG tablet Take 40 mg by mouth daily. 12/25/21  Yes [provider]  Wheat Dextrin (BENEFIBER PO) Take 1 tablet by mouth daily.   Yes [provider]  predniSONE  (DELTASONE ) 1 MG tablet Take 4 mg by mouth daily. Patient not taking: Reported on 09/08/2024 01/14/24   [provider]    Allergies as of 09/08/2024 - Review Complete 09/08/2024  Allergen Reaction Noted   Penicillins     Sulfonamide derivatives     Celebrex [celecoxib] Rash     Family History  Problem Relation  Age of Onset   Hypertension Father     Social History   Socioeconomic History   Marital status: Married    Spouse name: Not on file   Number of children: 1   Years of education: Not on file   Highest education level: Not on file  Occupational History   Occupation: Education administrator  Tobacco Use   Smoking status: Former    Current packs/day: 0.00    Types: Cigarettes    Quit date: 12/03/1979    Years since quitting: 44.7    Smokeless tobacco: Never  Vaping Use   Vaping status: Never Used  Substance and Sexual Activity   Alcohol use: Yes    Alcohol/week: 4.0 standard drinks of alcohol    Types: 4 Cans of beer per week    Comment: couple drinks per week   Drug use: Not Currently   Sexual activity: Yes  Other Topics Concern   Not on file  Social History Narrative   Not on file   Social Drivers of Health   Financial Resource Strain: Not on file  Food Insecurity: Not on file  Transportation Needs: Not on file  Physical Activity: Not on file  Stress: Not on file  Social Connections: Not on file  Intimate Partner Violence: Not on file    Review of Systems   See HPI, otherwise negative ROS  Physical Exam: BP 116/74 (BP Location: Right Arm, Patient Position: Sitting, Cuff Size: Large)   Pulse 64   Temp 98.6 F (37 C) (Oral)   Ht 6' (1.829 m)   Wt 257 lb 6.4 oz (116.8 kg)   SpO2 97%   BMI 34.91 kg/m  General:   Alert,  Well-developed, well-nourished, pleasant and cooperative in NAD Rectal exam perianal skin looks good he does have a couple tiny hemorrhoid tags digital exam reveals exquisite tenderness in the anal canal on the left side no fluctuance no mass very uncomfortable.  Impression/Plan:   Persisting proctalgia worse with a bowel movement status post internal sphincterotomy.  Significant proved but not resolution of his symptoms ongoing issues pain in the anal canal on the left side.  I doubt he has an abscess.  May have another fissure.  No significant hemorrhoids appreciated to explain his symptoms.  He has failed topical measures including lidocaine  and Cardizem.  I feel he would be best served by going back to see Dr. Mavis to see if an anoscopy under anesthesia would be useful.  I will reach out to Dr. Mavis to touch base with him in the near future.  Further recommendations to follow.    Notice: This dictation was prepared with Dragon dictation along with smaller phrase  technology. Any transcriptional errors that result from this process are unintentional and may not be corrected upon review.

## 2024-09-10 ENCOUNTER — Encounter: Payer: Self-pay | Admitting: General Surgery

## 2024-09-10 ENCOUNTER — Ambulatory Visit: Admitting: General Surgery

## 2024-09-10 VITALS — BP 109/77 | HR 55 | Temp 98.0°F | Resp 16 | Ht 72.0 in | Wt 259.0 lb

## 2024-09-10 DIAGNOSIS — K6289 Other specified diseases of anus and rectum: Secondary | ICD-10-CM | POA: Diagnosis not present

## 2024-09-10 NOTE — Addendum Note (Signed)
 Addended by: SAUNDRA TAWNI DEL on: 09/10/2024 12:21 PM   Modules accepted: Orders

## 2024-09-10 NOTE — Progress Notes (Signed)
 Subjective:     [name] [name]  Patient was referred back to my care by Dr. Shaaron of gastroenterology for recurrent proctalgia.  Patient states that he did have some relief after undergoing a fissurectomy in October 2024.  He states now he has pain with defecation.  He does have some difficulty starting to move his bowels.  His pain then lasts for over 24 hours.  He denies any blood per rectum.  He does state that his rectal pain is similar to what he had prior to his previous surgery. Objective:    BP 109/77   Pulse (!) 55   Temp 98 F (36.7 C) (Oral)   Resp 16   Ht 6' (1.829 m)   Wt 259 lb (117.5 kg)   SpO2 96%   BMI 35.13 kg/m   General:  alert, cooperative, and no distress  Lungs clear to auscultation with equal breath sounds bilaterally Heart examination reveals a regular rate and rhythm without S3, S4, murmurs Rectal examination reveals several hemorrhoidal skin tags.  I do not appreciate a fissure.  His sphincter does feel tight, but does relax.  Digital examination was limited secondary to discomfort.     Assessment:    Recurrent proctalgia    Plan:   Patient does not appear to have a recurrent fissure.  I do not appreciate an anal stricture.  He may have spasm or narrowing due to his internal sphincter not relaxing.  I am referring him to Dr. Elspeth Schultze in [place] for further management and treatment as this is outside my realm of expertise.  Patient may need further workup of his pelvic floor.  Patient understands and agrees.

## 2024-12-08 ENCOUNTER — Telehealth: Payer: Self-pay

## 2024-12-08 NOTE — Telephone Encounter (Signed)
 You can authorize one refill but I cannot put this in electronically.

## 2024-12-08 NOTE — Telephone Encounter (Signed)
 Refill request for [facility] Hemorrhoid Cream w/Dilt was received from [place] via fax. Pt was last seen by Dr. Shaaron on 09/08/24.

## 2024-12-14 NOTE — Telephone Encounter (Signed)
 done
